# Patient Record
Sex: Female | Born: 1945 | Race: Black or African American | Hispanic: No | Smoking: Never smoker
Health system: Southern US, Community
[De-identification: ages and names within clinical notes are randomized; demographics above are authoritative.]

## PROBLEM LIST (undated history)

## (undated) DIAGNOSIS — I639 Cerebral infarction, unspecified: Secondary | ICD-10-CM

---

## 2018-11-11 ENCOUNTER — Emergency Department (HOSPITAL_COMMUNITY): Payer: Medicare HMO

## 2018-11-11 ENCOUNTER — Other Ambulatory Visit: Payer: Self-pay

## 2018-11-11 ENCOUNTER — Inpatient Hospital Stay (HOSPITAL_COMMUNITY)
Admission: EM | Admit: 2018-11-11 | Discharge: 2018-11-14 | DRG: 871 | Disposition: A | Discharge status: Home Health | Payer: Medicare HMO | Attending: Internal Medicine | Admitting: Internal Medicine

## 2018-11-11 DIAGNOSIS — R6520 Severe sepsis without septic shock: Secondary | ICD-10-CM | POA: Diagnosis present

## 2018-11-11 DIAGNOSIS — R0602 Shortness of breath: Secondary | ICD-10-CM | POA: Diagnosis present

## 2018-11-11 DIAGNOSIS — Z91041 Radiographic dye allergy status: Secondary | ICD-10-CM | POA: Diagnosis not present

## 2018-11-11 DIAGNOSIS — Z9181 History of falling: Secondary | ICD-10-CM | POA: Diagnosis not present

## 2018-11-11 DIAGNOSIS — N390 Urinary tract infection, site not specified: Secondary | ICD-10-CM | POA: Diagnosis present

## 2018-11-11 DIAGNOSIS — B9620 Unspecified Escherichia coli [E. coli] as the cause of diseases classified elsewhere: Secondary | ICD-10-CM | POA: Diagnosis not present

## 2018-11-11 DIAGNOSIS — R000 Tachycardia, unspecified: Secondary | ICD-10-CM | POA: Diagnosis present

## 2018-11-11 DIAGNOSIS — E876 Hypokalemia: Secondary | ICD-10-CM | POA: Diagnosis present

## 2018-11-11 DIAGNOSIS — N179 Acute kidney failure, unspecified: Secondary | ICD-10-CM | POA: Diagnosis present

## 2018-11-11 DIAGNOSIS — A419 Sepsis, unspecified organism: Secondary | ICD-10-CM | POA: Diagnosis present

## 2018-11-11 DIAGNOSIS — A4151 Sepsis due to Escherichia coli [E. coli]: Secondary | ICD-10-CM | POA: Diagnosis present

## 2018-11-11 DIAGNOSIS — R062 Wheezing: Secondary | ICD-10-CM | POA: Diagnosis present

## 2018-11-11 DIAGNOSIS — I10 Essential (primary) hypertension: Secondary | ICD-10-CM | POA: Diagnosis present

## 2018-11-11 DIAGNOSIS — N3001 Acute cystitis with hematuria: Secondary | ICD-10-CM

## 2018-11-11 DIAGNOSIS — E785 Hyperlipidemia, unspecified: Secondary | ICD-10-CM | POA: Diagnosis present

## 2018-11-11 DIAGNOSIS — Z1611 Resistance to penicillins: Secondary | ICD-10-CM | POA: Diagnosis present

## 2018-11-11 DIAGNOSIS — Z8673 Personal history of transient ischemic attack (TIA), and cerebral infarction without residual deficits: Secondary | ICD-10-CM

## 2018-11-11 DIAGNOSIS — Z20828 Contact with and (suspected) exposure to other viral communicable diseases: Secondary | ICD-10-CM | POA: Diagnosis present

## 2018-11-11 DIAGNOSIS — G9341 Metabolic encephalopathy: Secondary | ICD-10-CM | POA: Diagnosis present

## 2018-11-11 DIAGNOSIS — R7881 Bacteremia: Secondary | ICD-10-CM

## 2018-11-11 DIAGNOSIS — R5381 Other malaise: Secondary | ICD-10-CM

## 2018-11-11 LAB — BLOOD CULTURE ID PANEL (REFLEXED)

## 2018-11-11 LAB — URINALYSIS, ROUTINE W REFLEX MICROSCOPIC
Bilirubin Urine: NEGATIVE
Glucose, UA: NEGATIVE mg/dL
Ketones, ur: NEGATIVE mg/dL
Nitrite: POSITIVE — AB
Protein, ur: 100 mg/dL — AB
Specific Gravity, Urine: 1.018 (ref 1.005–1.030)
pH: 5 (ref 5.0–8.0)

## 2018-11-11 LAB — CBC WITH DIFFERENTIAL/PLATELET
Abs Immature Granulocytes: 0.05 10*3/uL (ref 0.00–0.07)
Basophils Absolute: 0 10*3/uL (ref 0.0–0.1)
Basophils Relative: 0 %
Eosinophils Absolute: 0 10*3/uL (ref 0.0–0.5)
Eosinophils Relative: 0 %
HCT: 39 % (ref 36.0–46.0)
Hemoglobin: 11.8 g/dL — ABNORMAL LOW (ref 12.0–15.0)
Immature Granulocytes: 1 %
Lymphocytes Relative: 8 %
Lymphs Abs: 0.8 10*3/uL (ref 0.7–4.0)
MCH: 25.9 pg — ABNORMAL LOW (ref 26.0–34.0)
MCHC: 30.3 g/dL (ref 30.0–36.0)
MCV: 85.7 fL (ref 80.0–100.0)
Monocytes Absolute: 0.5 10*3/uL (ref 0.1–1.0)
Monocytes Relative: 5 %
Neutro Abs: 9.3 10*3/uL — ABNORMAL HIGH (ref 1.7–7.7)
Neutrophils Relative %: 86 %
Platelets: 265 10*3/uL (ref 150–400)
RBC: 4.55 MIL/uL (ref 3.87–5.11)
RDW: 15.1 % (ref 11.5–15.5)
WBC: 10.8 10*3/uL — ABNORMAL HIGH (ref 4.0–10.5)
nRBC: 0 % (ref 0.0–0.2)

## 2018-11-11 LAB — APTT: aPTT: 31 seconds (ref 24–36)

## 2018-11-11 LAB — PROTIME-INR
INR: 1.2 (ref 0.8–1.2)
Prothrombin Time: 14.8 seconds (ref 11.4–15.2)

## 2018-11-11 LAB — COMPREHENSIVE METABOLIC PANEL
ALT: 25 U/L (ref 0–44)
AST: 51 U/L — ABNORMAL HIGH (ref 15–41)
Albumin: 2.8 g/dL — ABNORMAL LOW (ref 3.5–5.0)
Alkaline Phosphatase: 97 U/L (ref 38–126)
Anion gap: 12 (ref 5–15)
BUN: 27 mg/dL — ABNORMAL HIGH (ref 8–23)
CO2: 18 mmol/L — ABNORMAL LOW (ref 22–32)
Calcium: 8.4 mg/dL — ABNORMAL LOW (ref 8.9–10.3)
Chloride: 110 mmol/L (ref 98–111)
Creatinine, Ser: 1.59 mg/dL — ABNORMAL HIGH (ref 0.44–1.00)
GFR calc Af Amer: 36 mL/min — ABNORMAL LOW (ref >60)
GFR calc non Af Amer: 31 mL/min — ABNORMAL LOW (ref >60)
Glucose, Bld: 119 mg/dL — ABNORMAL HIGH (ref 70–99)
Potassium: 4.1 mmol/L (ref 3.5–5.1)
Sodium: 140 mmol/L (ref 135–145)
Total Bilirubin: 1.4 mg/dL — ABNORMAL HIGH (ref 0.3–1.2)
Total Protein: 7 g/dL (ref 6.5–8.1)

## 2018-11-11 LAB — CBG MONITORING, ED: Glucose-Capillary: 103 mg/dL — ABNORMAL HIGH (ref 70–99)

## 2018-11-11 LAB — LACTIC ACID, PLASMA
Lactic Acid, Venous: 1.6 mmol/L (ref 0.5–1.9)
Lactic Acid, Venous: 2.8 mmol/L (ref 0.5–1.9)
Lactic Acid, Venous: 3.4 mmol/L (ref 0.5–1.9)
Lactic Acid, Venous: 1.8 mmol/L (ref 0.5–1.9)

## 2018-11-11 LAB — SARS CORONAVIRUS 2 BY RT PCR (HOSPITAL ORDER, PERFORMED IN ~~LOC~~ HOSPITAL LAB): SARS Coronavirus 2: NEGATIVE

## 2018-11-11 LAB — TROPONIN I (HIGH SENSITIVITY): Troponin I (High Sensitivity): 9 ng/L (ref <18)

## 2018-11-11 LAB — CK: Total CK: 1535 U/L — ABNORMAL HIGH (ref 38–234)

## 2018-11-11 MED ORDER — SODIUM CHLORIDE 0.9 % IV SOLN
1.0000 g | INTRAVENOUS | Status: DC
  Administered 2018-11-11: 1 g via INTRAVENOUS
  Filled 2018-11-11: qty 10

## 2018-11-11 MED ORDER — SODIUM CHLORIDE 0.9 % IV BOLUS
1000.0000 mL | Freq: Once | INTRAVENOUS | Status: AC
  Administered 2018-11-11: 1000 mL via INTRAVENOUS

## 2018-11-11 MED ORDER — ONDANSETRON HCL 4 MG/2ML IJ SOLN: 4.0000 mg | Freq: Four times a day (QID) | INTRAMUSCULAR | Status: DC | PRN

## 2018-11-11 MED ORDER — ENOXAPARIN SODIUM 40 MG/0.4ML ~~LOC~~ SOLN
40.0000 mg | SUBCUTANEOUS | Status: DC
  Administered 2018-11-11 – 2018-11-13 (×3): 40 mg via SUBCUTANEOUS
  Filled 2018-11-11 (×4): qty 0.4

## 2018-11-11 MED ORDER — LORAZEPAM 2 MG/ML IJ SOLN: 0.5000 mg | Freq: Once | INTRAMUSCULAR | Status: DC

## 2018-11-11 MED ORDER — VANCOMYCIN HCL IN DEXTROSE 1-5 GM/200ML-% IV SOLN
1000.0000 mg | Freq: Once | INTRAVENOUS | Status: AC
  Administered 2018-11-11: 1000 mg via INTRAVENOUS
  Filled 2018-11-11: qty 200

## 2018-11-11 MED ORDER — SODIUM CHLORIDE 0.9 % IV SOLN
2.0000 g | INTRAVENOUS | Status: DC
  Administered 2018-11-12 – 2018-11-13 (×2): 2 g via INTRAVENOUS
  Filled 2018-11-11 (×2): qty 20

## 2018-11-11 MED ORDER — ALBUTEROL SULFATE (2.5 MG/3ML) 0.083% IN NEBU
2.5000 mg | INHALATION_SOLUTION | Freq: Once | RESPIRATORY_TRACT | Status: AC
  Administered 2018-11-12: 2.5 mg via RESPIRATORY_TRACT
  Filled 2018-11-11: qty 3

## 2018-11-11 MED ORDER — SODIUM CHLORIDE 0.9 % IV BOLUS
500.0000 mL | Freq: Once | INTRAVENOUS | Status: AC
  Administered 2018-11-11: 500 mL via INTRAVENOUS

## 2018-11-11 MED ORDER — METRONIDAZOLE IN NACL 5-0.79 MG/ML-% IV SOLN
500.0000 mg | Freq: Once | INTRAVENOUS | Status: AC
  Administered 2018-11-11: 500 mg via INTRAVENOUS
  Filled 2018-11-11: qty 100

## 2018-11-11 MED ORDER — SODIUM CHLORIDE 0.9 % IV SOLN
2.0000 g | Freq: Once | INTRAVENOUS | Status: AC
  Administered 2018-11-11: 2 g via INTRAVENOUS
  Filled 2018-11-11: qty 2

## 2018-11-11 MED ORDER — ONDANSETRON HCL 4 MG PO TABS: 4.0000 mg | ORAL_TABLET | Freq: Four times a day (QID) | ORAL | Status: DC | PRN

## 2018-11-11 MED ORDER — ACETAMINOPHEN 650 MG RE SUPP
650.0000 mg | Freq: Once | RECTAL | Status: AC
  Administered 2018-11-11: 650 mg via RECTAL
  Filled 2018-11-11: qty 1

## 2018-11-11 MED ORDER — ACETAMINOPHEN 325 MG PO TABS
650.0000 mg | ORAL_TABLET | Freq: Four times a day (QID) | ORAL | Status: DC | PRN
  Administered 2018-11-12 – 2018-11-14 (×4): 650 mg via ORAL
  Filled 2018-11-11 (×4): qty 2

## 2018-11-11 MED ORDER — ACETAMINOPHEN 650 MG RE SUPP: 650.0000 mg | Freq: Four times a day (QID) | RECTAL | Status: DC | PRN

## 2018-11-11 NOTE — Significant Event (Signed)
Rapid Response Event Note  Overview:  Sepsis - Second Set of Eyes  Initial Focused Assessment: I came to Mariah May at the request of the primary nurse. Patient was sleeping when I came in, she woke to my voice, answer my questions, followed simple commands but quickly feel asleep after. Not in acute distress, skin warm and dry, + pulses, HR 70s, RR 18-20, 100% on RA, and SBP > 100 and MAP > 70s. Lung sounds - clear, good air movement.  Patient is being treated for UA - on Rocephin ( received Maxipime and Flagyl as well). Has received a total of 1.5L of NS (no current weight so unable to determine 30cc/kg for fluid resuscitation). LA 3.4 > 1.8   Interventions: -- None--  Plan of Care: -- Monitor VS  -- Strict I/O -- Update weight when able.  Event Summary:  Call Time Mariah May  Mariah May

## 2018-11-11 NOTE — Progress Notes (Signed)
PHARMACY - PHYSICIAN COMMUNICATION CRITICAL VALUE ALERT - BLOOD CULTURE IDENTIFICATION (BCID)  Mariah May is an 73 y.o. female who presented to [facility] on 11/11/2018 with sepsis secondary to UTI.   Assessment:  Blood culture positive for gram negative rods BCID detected E.coli and Enterobacteriaceae   Current antibiotics: Ceftriaxone 1 g IV q24h  Changes to prescribed antibiotics recommended:  Will change to Ceftriaxone 2 g IV q24h due to bacteremia   Results for orders placed or performed during the hospital encounter of 11/11/18  Blood Culture ID Panel (Reflexed) (Collected: 11/11/2018  7:37 AM)  Result Value Ref Range   Enterococcus species NOT DETECTED NOT DETECTED   Listeria monocytogenes NOT DETECTED NOT DETECTED   Staphylococcus species NOT DETECTED NOT DETECTED   Staphylococcus aureus (BCID) NOT DETECTED NOT DETECTED   Streptococcus species NOT DETECTED NOT DETECTED   Streptococcus agalactiae NOT DETECTED NOT DETECTED   Streptococcus pneumoniae NOT DETECTED NOT DETECTED   Streptococcus pyogenes NOT DETECTED NOT DETECTED   Acinetobacter baumannii NOT DETECTED NOT DETECTED   Enterobacteriaceae species DETECTED (A) NOT DETECTED   Enterobacter cloacae complex NOT DETECTED NOT DETECTED   Escherichia coli DETECTED (A) NOT DETECTED   Klebsiella oxytoca NOT DETECTED NOT DETECTED   Klebsiella pneumoniae NOT DETECTED NOT DETECTED   Proteus species NOT DETECTED NOT DETECTED   Serratia marcescens NOT DETECTED NOT DETECTED   Carbapenem resistance NOT DETECTED NOT DETECTED   Haemophilus influenzae NOT DETECTED NOT DETECTED   Neisseria meningitidis NOT DETECTED NOT DETECTED   Pseudomonas aeruginosa NOT DETECTED NOT DETECTED   Candida albicans NOT DETECTED NOT DETECTED   Candida glabrata NOT DETECTED NOT DETECTED   Candida krusei NOT DETECTED NOT DETECTED   Candida parapsilosis NOT DETECTED NOT DETECTED   Candida tropicalis NOT DETECTED NOT DETECTED    Lorel Monaco, PharmD PGY1  Ambulatory Care Resident [employer] # [phone]

## 2018-11-11 NOTE — ED Triage Notes (Signed)
Coming by EMS after calling this evening for help getting up off of the couch. Pt called yesterday for same instance but did not want to come into the hospital. EMS stated pt had left facial droop and slurred speech. Last known normal unknown. Pt unaware to any deficits. Vitals WDL. Received a 500cc bolus. Hx of stroke, and pt is HOH.No noted facial droop during assessment

## 2018-11-11 NOTE — Progress Notes (Signed)
Notified provider of need to order lactic acid. Second lactic acid resulted at 2.8 (higher than first lactate) MD notified via secure chat asking for a repeat lactate to be ordered.

## 2018-11-11 NOTE — Progress Notes (Signed)
Patients daughter Vita Barley was communicated to update her about her mothers plan of care and treatment. Nurse informed her that the doctor will give them a called back to update as well.

## 2018-11-11 NOTE — Progress Notes (Signed)
  Pt orientation to unit, room and routine. Information packet given to patient/family and safety video watched.  Admission INP armband ID verified with patient/family, and in place. SR up x 2, fall risk assessment complete with Patient and family verbalizing understanding of risks associated with falls. Pt verbalizes an understanding of how to use the call bell and to call for help before getting out of bed.  Skin, clean-dry- intact without evidence of bruising, or skin tears.   No evidence of skin break down noted on exam.  Will cont to monitor and assist as needed.  [place], RN 11/11/2018 4:45 PM

## 2018-11-11 NOTE — ED Provider Notes (Signed)
[facility] EMERGENCY DEPARTMENT Provider Note   CSN: [id number] Arrival date & time: 11/11/18  0531    History   Chief Complaint Chief Complaint  Patient presents with  . Stroke Like Symptoms    HPI Mariah May is a 73 y.o. female with an unknown medical hx presents to the Emergency Department complaining of acute right shoulder pain onset Thursday night (11/09/18) after a fall.  Pt does not know why she fell.  She reports afterwards she got on the couch and has not gotten off the couch since that time.  Patient reports she has felt generally weak since then but has not noticed any focal deficits.  She reports chronic back pain but no new or worsening back pain.  Patient denies aggravating or alleviating factors.  She denies fever, chills, headache, neck pain, chest pain, shortness of breath, abdominal pain, nausea, vomiting, diarrhea, dysuria, hematuria.  Per EMS, they were dispatched this morning for lifting assistance as patient wanted help getting off the couch.  Upon their arrival they realized that she had urinated on herself and was too weak to be left alone.  Additionally, they report that patient was evaluated by EMS around 9 AM on 11/10/2018 the patient refused to go to the hospital at that time.  Per EMS, it appears the patient lives at home.  They did not see any assistance devices such as walker or cane in the home.  No family was present and patient was unable to provide contact information for family.  Medical records reviewed, it does not appear that patient has been seen in this emergency department before.  There are no records in epic.     The history is provided by the patient and medical records. No language interpreter was used.    No past medical history on file.  There are no active problems to display for this patient.   OB History   No obstetric history on file.      Home Medications    Prior to Admission medications   Not on File     Family History No family history on file.  Social History Social History   Tobacco Use  . Smoking status: Not on file  Substance Use Topics  . Alcohol use: Not on file  . Drug use: Not on file     Allergies   Contrast media [iodinated diagnostic agents]   Review of Systems Review of Systems  Constitutional: Negative for appetite change, diaphoresis, fatigue, fever and unexpected weight change.  HENT: Negative for mouth sores.   Eyes: Negative for visual disturbance.  Respiratory: Negative for cough, chest tightness, shortness of breath and wheezing.   Cardiovascular: Negative for chest pain.  Gastrointestinal: Negative for abdominal pain, constipation, diarrhea, nausea and vomiting.  Endocrine: Negative for polydipsia, polyphagia and polyuria.  Genitourinary: Negative for dysuria, frequency, hematuria and urgency.  Musculoskeletal: Positive for arthralgias. Negative for back pain and neck stiffness.  Skin: Negative for rash.  Allergic/Immunologic: Negative for immunocompromised state.  Neurological: Positive for weakness. Negative for syncope, light-headedness and headaches.  Hematological: Does not bruise/bleed easily.  Psychiatric/Behavioral: Negative for sleep disturbance. The patient is not nervous/anxious.      Physical Exam Updated Vital Signs BP (!) 125/99   Pulse (!) 103   Resp (!) 23   SpO2 98%   Physical Exam Vitals signs and nursing note reviewed.  Constitutional:      General: She is not in acute distress.    Appearance:  She is not diaphoretic.     Comments: Pt very hard of hearing  HENT:     Head: Normocephalic.  Eyes:     General: No scleral icterus.    Conjunctiva/sclera: Conjunctivae normal.  Neck:     Musculoskeletal: Normal range of motion.  Cardiovascular:     Rate and Rhythm: Normal rate and regular rhythm.     Pulses: Normal pulses.          Radial pulses are 2+ on the right side and 2+ on the left side.  Pulmonary:     Effort:  No tachypnea, accessory muscle usage, prolonged expiration, respiratory distress or retractions.     Breath sounds: No stridor.     Comments: Equal chest rise. No increased work of breathing. Abdominal:     General: There is no distension.     Palpations: Abdomen is soft.     Tenderness: There is no abdominal tenderness. There is no guarding or rebound.  Musculoskeletal:     Right shoulder: She exhibits pain. She exhibits normal range of motion and no swelling.     Right elbow: Normal.    Right wrist: Normal.       Arms:     Right lower leg: 1+ Edema present.     Left lower leg: 1+ Edema present.     Comments: Moves all extremities equally and without difficulty.  Skin:    General: Skin is warm and dry.     Capillary Refill: Capillary refill takes less than 2 seconds.  Neurological:     Mental Status: She is alert.     GCS: GCS eye subscore is 4. GCS verbal subscore is 5. GCS motor subscore is 6.     Comments: Mental Status:  Alert. Speech fluent without evidence of aphasia. Able to follow 1 step commands without difficulty.  Cranial Nerves:  II:  Peripheral visual fields grossly normal, pupils equal, round, reactive to light III,IV, VI: ptosis not present, extra-ocular motions intact bilaterally  V,VII: smile symmetric, facial light touch sensation equal VIII: hearing grossly normal to voice  X: uvula elevates symmetrically  XI: bilateral shoulder shrug symmetric and strong XII: midline tongue extension without fassiculations Motor:  Normal tone. 5/5 in upper and lower extremities bilaterally including strong and equal grip strength and dorsiflexion/plantar flexion Sensory: Light touch normal in all extremities.  Cerebellar: normal finger-to-nose with bilateral upper extremities Gait: gait not tested on initial exam CV: distal pulses palpable throughout   Psychiatric:        Mood and Affect: Mood normal.      ED Treatments / Results  Labs (all labs ordered are listed,  but only abnormal results are displayed) Labs Reviewed  CBC WITH DIFFERENTIAL/PLATELET - Abnormal; Notable for the following components:      Result Value   WBC 10.8 (*)    Hemoglobin 11.8 (*)    MCH 25.9 (*)    Neutro Abs 9.3 (*)    All other components within normal limits  COMPREHENSIVE METABOLIC PANEL - Abnormal; Notable for the following components:   CO2 18 (*)    Glucose, Bld 119 (*)    BUN 27 (*)    Creatinine, Ser 1.59 (*)    Calcium 8.4 (*)    Albumin 2.8 (*)    AST 51 (*)    Total Bilirubin 1.4 (*)    GFR calc non Af Amer 31 (*)    GFR calc Af Amer 36 (*)    All other  components within normal limits  CBG MONITORING, ED - Abnormal; Notable for the following components:   Glucose-Capillary 103 (*)    All other components within normal limits  CULTURE, BLOOD (ROUTINE X 2)  CULTURE, BLOOD (ROUTINE X 2)  URINE CULTURE  SARS CORONAVIRUS 2 (HOSPITAL ORDER, PERFORMED IN [facility])  LACTIC ACID, PLASMA  URINALYSIS, ROUTINE W REFLEX MICROSCOPIC  LACTIC ACID, PLASMA  CK  APTT  PROTIME-INR  TROPONIN I (HIGH SENSITIVITY)    EKG EKG Interpretation  Date/Time:  Saturday November 11 2018 05:48:18 EDT Ventricular Rate:  97 PR Interval:    QRS Duration: 163 QT Interval:  384 QTC Calculation: 488 R Axis:   -52 Text Interpretation:  Sinus rhythm Nonspecific IVCD with LAD Left ventricular hypertrophy No previous ECGs available Confirmed by Glynn Octaveancour, Stephen [phone]([id number]) on 11/11/2018 6:10:56 AM     Radiology Dg Chest 2 View  Result Date: 11/11/2018 CLINICAL DATA:  Patient with altered mental status EXAM: CHEST - 2 VIEW COMPARISON:  None. FINDINGS: Monitoring leads overlie the patient. Cardiomegaly. No large area of pulmonary consolidation. No pleural effusion or pneumothorax. Thoracic spine degenerative changes. Lateral view limited due to overlapping soft tissue. IMPRESSION: No acute cardiopulmonary process. Electronically Signed   By: Annia Beltrew  Davis M.D.   On:  11/11/2018 06:21   Dg Shoulder Right  Result Date: 11/11/2018 CLINICAL DATA:  Altered mental status slurred speech. EXAM: RIGHT SHOULDER - 2+ VIEW COMPARISON:  None. FINDINGS: Limited exam secondary to difficulty with patient positioning. Within the above limitation no evidence for acute displaced fracture or dislocation. Visualized right hemithorax unremarkable. IMPRESSION: Limited exam secondary to difficulty with patient positioning. No evidence for displaced fracture or dislocation. Electronically Signed   By: Annia Beltrew  Davis M.D.   On: 11/11/2018 06:20    Procedures .Critical Care Performed by: Dierdre ForthMuthersbaugh, Loden Laurent, PA-C Authorized by: Dierdre ForthMuthersbaugh, Alliana Mcauliff, PA-C   Critical care provider statement:    Critical care time (minutes):  45   Critical care time was exclusive of:  Separately billable procedures and treating other patients and teaching time   Critical care was necessary to treat or prevent imminent or life-threatening deterioration of the following conditions:  Sepsis   Critical care was time spent personally by me on the following activities:  Discussions with consultants, evaluation of patient's response to treatment, examination of patient, ordering and performing treatments and interventions, ordering and review of laboratory studies, ordering and review of radiographic studies, pulse oximetry, re-evaluation of patient's condition, obtaining history from patient or surrogate and review of old charts   I assumed direction of critical care for this patient from another provider in my specialty: no     (including critical care time)  Medications Ordered in ED Medications  ceFEPIme (MAXIPIME) 2 g in sodium chloride 0.9 % 100 mL IVPB (has no administration in time range)  metroNIDAZOLE (FLAGYL) IVPB 500 mg (has no administration in time range)  vancomycin (VANCOCIN) IVPB 1000 mg/200 mL premix (has no administration in time range)  sodium chloride 0.9 % bolus 1,000 mL (has no  administration in time range)     Initial Impression / Assessment and Plan / ED Course  I have reviewed the triage vital signs and the nursing notes.  Pertinent labs & imaging results that were available during my care of the patient were reviewed by me and considered in my medical decision making (see chart for details).  Clinical Course as of Nov 10 712  Sat Nov 11, 2018  [id number] Pt  febrile. Code sepsis initiated.   Temp(!): 101.7 F (38.7 C) [HM]    Clinical Course User Index [HM] Lyndall Windt, Dahlia ClientHannah, PA-C       Richard MiuHelen Omary was evaluated in Emergency Department on 11/11/2018 for the symptoms described in the history of present illness. She was evaluated in the context of the global COVID-19 pandemic, which necessitated consideration that the patient might be at risk for infection with the SARS-CoV-2 virus that causes COVID-19. Institutional protocols and algorithms that pertain to the evaluation of patients at risk for COVID-19 are in a state of rapid change based on information released by regulatory bodies including the CDC and federal and state organizations. These policies and algorithms were followed during the patient's care in the ED.   Presents with weakness after fall.  She has not walked in several days.  Patient incontinent of urine.  On exam she is tachycardic and slightly tachypneic.  Febrile.  Code sepsis called.  Fluids and abx.  Mild anemia, elevated serum creatinine, normal lactic acid.  Patient has never been seen here in the emergency department or in the system before.  Unknown baseline.  Question new AKI versus chronic kidney disease.  Chest x-ray without evidence of pneumonia or infiltrates.  No pulmonary edema.  Right shoulder without evidence of fracture or dislocation.  Pt will need to be admitted.  Additional Labs and imaging pending.  COVID pending.   7:12 AM At shift change care was transferred to [facility] Fawze, PA-C who will follow pending studies,  re-evaulate and determine disposition.    The patient was discussed with and seen by Dr. Manus Gunningancour who agrees with the treatment plan.    Final Clinical Impressions(s) / ED Diagnoses   Final diagnoses:  Sepsis with acute renal failure without septic shock, due to unspecified organism, unspecified acute renal failure type (HCC)  AKI (acute kidney injury) [facility](HCC)  Tachycardia    ED Discharge Orders    None       Milta DeitersMuthersbaugh, Mava Suares, PA-C 11/11/18 0715    Glynn Octaveancour, Stephen, MD 11/12/18 [phone]

## 2018-11-11 NOTE — Progress Notes (Signed)
Patient's daughter, Stephannie Peters, called.  Daughter updated on patient's condition.

## 2018-11-11 NOTE — ED Notes (Addendum)
PT's daughter Vita [phone]) and son ([phone]) gave history of patient  Pt just moved from [place], has had multiple Strokes, frequent falls , HLD, HTN, uses a rollator to ambulate, does not use oxygen at home, lives about 5-72mins from son. They are unsure of her med dosage but believes she takes amlodipine and atorvasttin.  Patient's daughter states that sometimes she could get anxious and requires a "talk-down."

## 2018-11-11 NOTE — H&P (Signed)
Date: 11/11/2018               Patient Name:  Mariah May MRN: [id number]  DOB: Nov 10, 1945 Age / Sex: 73 y.o., female   PCP: Patient, No Pcp Per         Medical Service: Internal Medicine Teaching Service         Attending Physician: Dr. Aldine Contes, MD    First Contact: Dr. Charleen Kirks Pager: [phone]  Second Contact: Dr. Tarri Abernethy Pager: [phone]       After Hours (After 5p/  First Contact Pager: [phone]  weekends / holidays): Second Contact Pager: [phone]   Chief Complaint: Altered mental status  History of Present Illness:   Mariah May is a 73 y/o female with unknown past medical history who presents to the ED with altered mental status.   Mariah May was unable to provide a history of the recent events. Mariah May stated Mariah May was unable to remember what occurred in the past couple days, not the ambulance ride. Endorses SOB but denies chest pain, headache, dizziness, abdominal pain, dysuria, frequency.   Per EMS, they were called out to her home yesterday at 9am for patient needing assistance onto her couch. At that time Mariah May denied wanting to go to the hospital. Today, they were called out again for fall. Upon arrival, EMS found patient on the couch and soaked with urine. Mariah May told them Mariah May had not eaten or drank anything since the day before when Mariah May got on the couch. EMS noted left sided drooping and slurred speech, but no weakness in extremities. Mariah May was tachycardic, tachypneic and febrile.   Attempted to call son and daughter for collateral information but no answer.   ED Course:  CT Head showed advanced chronic vessel ischemia in the bilateral thalami white matter.  No acute infarct.  Chest x-ray was negative.  Shoulder x-ray negative.  EBC showed an elevated white blood count of 10.8 and hemoglobin of 11.8.  CMP showed normal sodium and potassium, however bicarb of 18 and normal anion gap.  Evidence of acute kidney injury with a creatinine of 1.59 and BUN of 27.  AST is  mildly elevated at 51, with a normal ALT.  Lactic acid was trended initially 1.6 but increased to 2.8.  Increased again to 3.4 but went down to 1.8.  PT INR unremarkable.  UA showed large hemoglobin, 100 protein, positive nitrite, moderate leukocyte, many bacteria.  Blood cultures drawn and pending.   Meds:  Current Meds  Medication Sig  . amLODipine (NORVASC) 5 MG tablet Take 5 mg by mouth daily.  Marland Kitchen atorvastatin (LIPITOR) 40 MG tablet Take 40 mg by mouth daily.  . diclofenac sodium (VOLTAREN) 1 % GEL Apply 2 g topically 4 (four) times daily as needed for pain.    Allergies: Allergies as of 11/11/2018 - Review Complete 11/11/2018  Allergen Reaction Noted  . Contrast media [iodinated diagnostic agents]  11/11/2018   No past medical history on file.  Family History:  Unable to obtain family history.  Social History:  Per nursing note, patient recently moved here from [place].  Lives alone.  Review of Systems: A complete ROS was negative except as per HPI.   Physical Exam: Blood pressure (!) 104/52, pulse 78, temperature 98 F (36.7 C), temperature source Oral, resp. rate 18, SpO2 99 %.  Physical Exam Vitals signs and nursing note reviewed.  Constitutional:      Interventions: Face mask in place.     Comments:  Patient allows me to listen for heart sounds but denied consent for rest of exam.  Cardiovascular:     Rate and Rhythm: Normal rate and regular rhythm.     Heart sounds: No murmur. No gallop.   Pulmonary:     Effort: No respiratory distress.  Skin:    General: Skin is warm and dry.     Coloration: Skin is not jaundiced or pale.  Neurological:     Mental Status: Mariah May is lethargic and disoriented.  Psychiatric:        Behavior: Behavior is uncooperative.    EKG: personally reviewed my interpretation is: Lots of artifact. Tachycardia.   CXR: personally reviewed my interpretation is: No evidence of pleural effusions.  No right sided infiltrates.  Difficulty in  viewing left cardiac silhouette.  Assessment & Plan by Problem: Active Problems:   Sepsis secondary to UTI [facility](HCC)   #Urosepsis: Patient presented to the ED with tachycardia, tachypnea and febrile.  UA results were negative of UTI as demonstrated by positive nitrite, moderate leukocytes, many bacteria and 21-50 WBCs.  Blood cultures are pending. Mariah May received Maxipime, Vancomycin, and Metronidazole in the ED. Vital signs have stabilized, with the exception of soft BP at 100s systolic. MAP is 67-71. Pt not on any HTN meds currently. Will continue to closely monitor.   -Ceftriaxone  -Trend WBC -Telemetry - F/U blood cultures   #Lactic acidosis: Lactic acid was was initially 1.6 but trended upwards to a maximum of 3.4.  It has resolved with fluid resuscitation, NS totaling 2.5L.   Resolved.   #AKI: Patient's past medical history is uncertain, so Mariah May could have a history of chronic kidney disease that would bump her creatinine.  Today her BUN is 27 with a creatinine of 1.59. Mariah May has received 2.5L of NS. Will reassess with CMP tomorrow AM, as well as attempt to gather additional information from family.    Dispo: Admit patient to Inpatient with expected length of stay greater than 2 midnights.   Signed:  Dr. Verdene LennertIulia Mazal [facility] Internal Medicine PGY-1  Pager: [phone] 11/11/2018, 7:22 PM

## 2018-11-11 NOTE — ED Provider Notes (Signed)
Received patient at signout from [facility].  Refer to provider note for full history and physical examination.  Briefly, patient is a 73 year old female with unknown past medical history presenting for evaluation after fall of unknown etiology on Thursday 2 days ago.  It is likely she was left on her couch for 36 to 48 hours.  Patient found to be altered and septic, febrile with a leukocytosis.  Broad-spectrum antibiotics were initiated.  Chest x-ray is negative.  Imaging of the head and neck show no acute abnormalities.  No evidence of CVA.  Pending COVID swab and UA.  Will likely require admission.  Physical Exam  BP (!) 148/77   Pulse (!) 101   Temp (!) 101.7 F (38.7 C) (Oral)   Resp (!) 27   LMP  (LMP Unknown)   SpO2 98%   Physical Exam Vitals signs and nursing note reviewed.  Constitutional:      General: She is not in acute distress.    Appearance: She is well-developed.  HENT:     Head: Normocephalic and atraumatic.     Mouth/Throat:     Mouth: Mucous membranes are dry.  Eyes:     General:        Right eye: No discharge.        Left eye: No discharge.     Conjunctiva/sclera: Conjunctivae normal.  Neck:     Vascular: No JVD.     Trachea: No tracheal deviation.  Cardiovascular:     Rate and Rhythm: Regular rhythm. Tachycardia present.     Comments: 2+ radial and DP pulses bilaterally.  Trace pitting edema of the bilateral lower extremities near the ankle. Pulmonary:     Effort: Pulmonary effort is normal.     Breath sounds: Normal breath sounds.  Abdominal:     General: There is no distension.     Palpations: Abdomen is soft.     Tenderness: There is no abdominal tenderness. There is no guarding or rebound.  Skin:    General: Skin is warm and dry.     Findings: No erythema.  Neurological:     Mental Status: She is alert.     GCS: GCS eye subscore is 4. GCS verbal subscore is 5. GCS motor subscore is 6.     Comments: No facial droop.  Follows some commands.  She  is hard of hearing.  Moves extremity spontaneously without difficulty.  Sensation intact to light touch of upper and lower extremities.  Oriented to person and place only.  Psychiatric:        Behavior: Behavior normal.     ED Course/Procedures   Labs Reviewed  CBC WITH DIFFERENTIAL/PLATELET - Abnormal; Notable for the following components:      Result Value   WBC 10.8 (*)    Hemoglobin 11.8 (*)    MCH 25.9 (*)    Neutro Abs 9.3 (*)    All other components within normal limits  COMPREHENSIVE METABOLIC PANEL - Abnormal; Notable for the following components:   CO2 18 (*)    Glucose, Bld 119 (*)    BUN 27 (*)    Creatinine, Ser 1.59 (*)    Calcium 8.4 (*)    Albumin 2.8 (*)    AST 51 (*)    Total Bilirubin 1.4 (*)    GFR calc non Af Amer 31 (*)    GFR calc Af Amer 36 (*)    All other components within normal limits  URINALYSIS, ROUTINE W REFLEX  MICROSCOPIC - Abnormal; Notable for the following components:   APPearance HAZY (*)    Hgb urine dipstick LARGE (*)    Protein, ur 100 (*)    Nitrite POSITIVE (*)    Leukocytes,Ua MODERATE (*)    Bacteria, UA MANY (*)    All other components within normal limits  LACTIC ACID, PLASMA - Abnormal; Notable for the following components:   Lactic Acid, Venous 2.8 (*)    All other components within normal limits  CK - Abnormal; Notable for the following components:   Total CK 1,535 (*)    All other components within normal limits  CBG MONITORING, ED - Abnormal; Notable for the following components:   Glucose-Capillary 103 (*)    All other components within normal limits  CULTURE, BLOOD (ROUTINE X 2)  CULTURE, BLOOD (ROUTINE X 2)  URINE CULTURE  SARS CORONAVIRUS 2 (HOSPITAL ORDER, PERFORMED IN [facility])  LACTIC ACID, PLASMA  APTT  PROTIME-INR  TROPONIN I (HIGH SENSITIVITY)    MDM   UA suggestive of UTI.  Possible AKI though unclear of patient's baseline.  CK is elevated at 1535 suggesting rhabdomyolysis.  Initial  lactate negative, repeat trending upward at 2.8.  She did receive 1 L normal saline, though due to unknown medical history additional fluids were held in order to avoid volume overload/pulmonary edema in an otherwise hemodynamically stable patient.  We will add an additional 500 cc bolus.  Patient tells me that she is originally from [place] but moved here several years ago and has not been seen by her primary care physician in quite some time.  9:08 AM RN was able to contact patient's daughter Zenon MayoSheena and son [place].  Patient has a history of hypertension, hyperlipidemia, osteoarthritis, multiple TIAs and CVA previously.  She takes amlodipine and atorvastatin. Is seen by Dr. Mortimer Friessai-Bonsu.  Son reports that he last saw the patient yesterday and noted that she was a bit confused.  They do not believe that she had been immobilized for up to 48 hours.  They live near the patient and check up on her frequently but she does live independently.  She does ambulate with the aid of a Rollator and walker.  Patient had an episode in the ED in which she was tachypneic and anxious with improvement with coaching.  Repeat chest x-ray shows no evidence of volume overload or acute pulmonary edema.  Internal medicine teaching service to admit.  COVID test is negative.      Jeanie SewerFawze, Jariana Shumard A, PA-C 11/11/18 1021    Cathren LaineSteinl, Kevin, MD 11/12/18 [phone]

## 2018-11-11 NOTE — ED Notes (Signed)
Patient's children states patient does not use home oxygen O2 Latta removed Will continue to re-assess

## 2018-11-12 DIAGNOSIS — A4151 Sepsis due to Escherichia coli [E. coli]: Principal | ICD-10-CM

## 2018-11-12 DIAGNOSIS — N390 Urinary tract infection, site not specified: Secondary | ICD-10-CM

## 2018-11-12 DIAGNOSIS — B9620 Unspecified Escherichia coli [E. coli] as the cause of diseases classified elsewhere: Secondary | ICD-10-CM

## 2018-11-12 DIAGNOSIS — R7881 Bacteremia: Secondary | ICD-10-CM

## 2018-11-12 DIAGNOSIS — R6520 Severe sepsis without septic shock: Secondary | ICD-10-CM

## 2018-11-12 DIAGNOSIS — N179 Acute kidney failure, unspecified: Secondary | ICD-10-CM

## 2018-11-12 DIAGNOSIS — E876 Hypokalemia: Secondary | ICD-10-CM

## 2018-11-12 DIAGNOSIS — R062 Wheezing: Secondary | ICD-10-CM

## 2018-11-12 LAB — COMPREHENSIVE METABOLIC PANEL
ALT: 33 U/L (ref 0–44)
AST: 59 U/L — ABNORMAL HIGH (ref 15–41)
Albumin: 2.3 g/dL — ABNORMAL LOW (ref 3.5–5.0)
Alkaline Phosphatase: 80 U/L (ref 38–126)
Anion gap: 9 (ref 5–15)
BUN: 17 mg/dL (ref 8–23)
CO2: 18 mmol/L — ABNORMAL LOW (ref 22–32)
Calcium: 8.1 mg/dL — ABNORMAL LOW (ref 8.9–10.3)
Chloride: 113 mmol/L — ABNORMAL HIGH (ref 98–111)
Creatinine, Ser: 1.02 mg/dL — ABNORMAL HIGH (ref 0.44–1.00)
GFR calc Af Amer: >60 mL/min (ref >60)
GFR calc non Af Amer: 54 mL/min — ABNORMAL LOW (ref >60)
Glucose, Bld: 114 mg/dL — ABNORMAL HIGH (ref 70–99)
Potassium: 3.2 mmol/L — ABNORMAL LOW (ref 3.5–5.1)
Sodium: 140 mmol/L (ref 135–145)
Total Bilirubin: 0.8 mg/dL (ref 0.3–1.2)
Total Protein: 6.1 g/dL — ABNORMAL LOW (ref 6.5–8.1)

## 2018-11-12 LAB — CBC
HCT: 33.4 % — ABNORMAL LOW (ref 36.0–46.0)
Hemoglobin: 10.5 g/dL — ABNORMAL LOW (ref 12.0–15.0)
MCH: 26.1 pg (ref 26.0–34.0)
MCHC: 31.4 g/dL (ref 30.0–36.0)
MCV: 82.9 fL (ref 80.0–100.0)
Platelets: 191 10*3/uL (ref 150–400)
RBC: 4.03 MIL/uL (ref 3.87–5.11)
RDW: 15 % (ref 11.5–15.5)
WBC: 7.3 10*3/uL (ref 4.0–10.5)
nRBC: 0 % (ref 0.0–0.2)

## 2018-11-12 MED ORDER — POTASSIUM CHLORIDE 10 MEQ/100ML IV SOLN: 10.0000 meq | INTRAVENOUS | Status: DC

## 2018-11-12 MED ORDER — IPRATROPIUM-ALBUTEROL 0.5-2.5 (3) MG/3ML IN SOLN
3.0000 mL | Freq: Four times a day (QID) | RESPIRATORY_TRACT | Status: DC
  Administered 2018-11-12: 3 mL via RESPIRATORY_TRACT

## 2018-11-12 MED ORDER — IPRATROPIUM-ALBUTEROL 0.5-2.5 (3) MG/3ML IN SOLN: 3.0000 mL | Freq: Four times a day (QID) | RESPIRATORY_TRACT | Status: DC | PRN

## 2018-11-12 MED ORDER — POTASSIUM CHLORIDE 20 MEQ PO PACK
40.0000 meq | PACK | Freq: Two times a day (BID) | ORAL | Status: AC
  Administered 2018-11-12 (×2): 40 meq via ORAL
  Filled 2018-11-12 (×2): qty 2

## 2018-11-12 MED ORDER — IPRATROPIUM-ALBUTEROL 0.5-2.5 (3) MG/3ML IN SOLN
3.0000 mL | Freq: Four times a day (QID) | RESPIRATORY_TRACT | Status: DC
  Administered 2018-11-12: 3 mL via RESPIRATORY_TRACT
  Filled 2018-11-12: qty 3

## 2018-11-12 MED ORDER — IPRATROPIUM-ALBUTEROL 0.5-2.5 (3) MG/3ML IN SOLN: 3.0000 mL | Freq: Four times a day (QID) | RESPIRATORY_TRACT | Status: DC

## 2018-11-12 NOTE — Progress Notes (Signed)
   Subjective:    Mariah May is feeling much better today and is very hungry. No acute complaints at this time, other than wanting to eat.   Objective: Vital signs in last 24 hours: Vitals:   11/11/18 1722 11/12/18 0131 11/12/18 0141 11/12/18 0622  BP: (!) 104/52  (!) 110/55   Pulse: 78  60   Resp: 18  (!) 24   Temp:   98.2 F (36.8 C) 98.7 F (37.1 C)  TempSrc:   Oral Oral  SpO2: 99% 99% 100%    Physical Exam Vitals signs and nursing note reviewed.  Constitutional:      Appearance: She is obese.  HENT:     Head: Normocephalic and atraumatic.     Mouth/Throat:     Mouth: Mucous membranes are dry.  Cardiovascular:     Rate and Rhythm: Normal rate and regular rhythm.  Pulmonary:     Effort: Pulmonary effort is normal. No respiratory distress.     Breath sounds: Wheezing present. No rales.  Abdominal:     General: Bowel sounds are normal. There is no distension.     Palpations: Abdomen is soft.     Tenderness: There is no abdominal tenderness.  Skin:    General: Skin is warm and dry.  Neurological:     General: No focal deficit present.     Mental Status: She is alert.     Comments: Oriented to person and place. Significantly improved mental status.     Assessment/Plan:  Active Problems:   Sepsis secondary to UTI ([place])  #E. Coli Urosepsis:  Last night, patient's blood and urine cultures returned positive for E. Coli. Pharmacy increased her Ceftriaxone dose to 2g. Today, Mariah May has significantly improved. She has much better mentation, as well as stabilization of her vital signs.  No febrile events in the past 24 hours.  Leukocytosis has resolved.  We will continue to monitor.   Discussed with patient that she has bacteria in her blood and the current treatment plan. All questions and concerns addressed.   - Ceftriaxone 2 g - Telemetry   #AKI: Creatinine has improved substantially since admission.  Creatinine is now 1.02.  We will continue to trend.    #Wheezing Patient denies a history of COPD or wheezing in the past, however her exam showed diffuse wheezing bilaterally.  She is still somewhat altered, unknown if dementia is also playing a role, and has been unable to reach her family to get past medical history.  Albuterol treatment last night helped, so plan to schedule duo nebs.  -DuoNeb every 6 hours    #Hypokalemia Potassium is mildly decreased today at 3.2.  Replenished with 40 mEq of K-Dur.  Will reassess tomorrow     Dispo: Anticipated discharge pending clinical improvement.   Dr. Jose Persia Internal Medicine PGY-1  Pager: [phone] 11/12/2018, 1:07 PM

## 2018-11-12 NOTE — Progress Notes (Signed)
Tried to contact the patient's daughter, Stephannie Peters, to discuss patient's progression and current plan. No answer. No voicemail left.   Ina Homes, MD  [facility] PGY3  Pager: [phone]

## 2018-11-12 NOTE — Plan of Care (Signed)

## 2018-11-12 NOTE — Progress Notes (Signed)
   11/12/18 0000  Pre-Screen Questions- If "YES" to any of the following questions, STOP the screen, keep NPO, and place order for SLP eval and treat   Home diet required thickened liquids No  Trach tube present No  Radiation to Head/Neck No  Patient Readiness for Screen - If "YES" to any of the following questions, WAIT to screen, keep NPO  Is patient lethargic or unable to stay alert/awake? No  HOB restricted to <30 degrees No  NPO for planned procedure No  Brief Cognitive Screen- Aspiration Risk Assessment  Is patient oriented to name, place, or year? No  Able to open mouth, stick out tongue, or smile No  Oral Mechanism Exam  Able to seal lips No  Able to move tongue from side to side Yes  Face is symmetric No  Mandatory Oral Care Performed   Mandatory oral care performed Yes  3 oz Water Swallow Challenge  Does the patient stop drinking? (!) Yes  Does the patient cough/choke? No  Screening Result (!) Failed   Patient has failed swallow screening due to cognition.  Patient was asleep during most of day, per day shift RN.  Patient was asleep for most of evening.   Patient HOB raised to 90 degrees and given water via cup and straw.  Patient abel to hold cup, but difficulty forming seal on straw d/t hx previous stroke.    Patient was instructed to drink liquid without stopping.  Patient able to drink fluid, but forgot that she has liquid in mouth.  Patient did not swallow until prompted by RN. Attempted swallow eval challenge again, patient able to swallow, but pauses for several seconds before swallowing between sips.

## 2018-11-12 NOTE — Progress Notes (Signed)
  Date: 11/12/2018  Patient name: Berenis Corter  Medical record number: [id number]  Date of birth: 19-Sep-1945   I have seen and evaluated Jonah Blue and discussed their care with the Residency Team.  In brief, patient is 73 year old female with unknown past medical history who presented to the ED with altered mental status x1 day.  EMS was called out to the patient's home on the day prior to her admission for assistance with getting onto her couch.  Patient stated at the time that she not want to go to the hospital.  Yesterday, they were called out again for a fall.  Upon arrival, EMS noted the patient was on couch and was soaked with urine.  Patient had told him that she not eaten or drank since the day before.  He was also noted left-sided drooping and slurred speech and that the patient was tachycardic, tachypneic and febrile.  She was brought to the hospital for further evaluation.  Patient unable to provide a good history at this time but states that she is feeling better today.  No chest pain, shortness of breath, no palpitations, no lightheadedness, no syncope, no nausea or vomiting, no diarrhea, no abdominal pain.  Today patient states that she feels better and is oriented x3.  She has no complaints currently except wanting to eat.  PMHx, Fam Hx, and/or Soc Hx : As per resident note  Vitals:   11/12/18 1100 11/12/18 1422  BP: (!) 109/58 122/64  Pulse:  85  Resp:  (!) 21  Temp: 98.7 F (37.1 C) 98.7 F (37.1 C)  SpO2:  97%   General: Awake, alert, x3, NAD CVs: Regular rate and rhythm, normal heart sounds Lungs: Bilateral expiratory wheeze noted Abdomen: Soft, nontender, nondistended, normoactive bowel sounds Extremities: No edema noted HEENT: Normocephalic, atraumatic Psych: Normal mood and affect Neuro: Oriented x3, no focal deficit noted  Assessment and Plan: I have seen and evaluated the patient as outlined above. I agree with the formulated Assessment and Plan as detailed  in the residents' note, with the following changes:   1.  Sepsis secondary to UTI: -Patient presented to the ED with altered mental status and was found to have sepsis secondary to UTI. -Patient noted to have E. coli bacteremia as well as E. coli in the urine.  Will await sensitivities -Continue ceftriaxone 2 g every 24 hours for now -Leukocytosis and lactic acidosis have resolved and her mentation is improving.  We will continue to monitor closely -Patient also was noted to have an AKI on admission with admission creatinine of 1.5 which was likely prerenal in nature.  Her creatinine is now improved to 1.02.  We will continue to monitor -Of note, patient was also noted to be wheezing on exam today.  It is unknown what her past medical history is and we will need to speak to the patient's family to see if she is on any medications at home.  We will continue with duo nebs for now  Aldine Contes, MD 8/2/20205:17 PM

## 2018-11-13 LAB — CBC WITH DIFFERENTIAL/PLATELET
Abs Immature Granulocytes: 0.32 10*3/uL — ABNORMAL HIGH (ref 0.00–0.07)
Basophils Absolute: 0 10*3/uL (ref 0.0–0.1)
Basophils Relative: 1 %
Eosinophils Absolute: 0.1 10*3/uL (ref 0.0–0.5)
Eosinophils Relative: 3 %
HCT: 33.1 % — ABNORMAL LOW (ref 36.0–46.0)
Hemoglobin: 10.3 g/dL — ABNORMAL LOW (ref 12.0–15.0)
Immature Granulocytes: 7 %
Lymphocytes Relative: 27 %
Lymphs Abs: 1.3 10*3/uL (ref 0.7–4.0)
MCH: 25.5 pg — ABNORMAL LOW (ref 26.0–34.0)
MCHC: 31.1 g/dL (ref 30.0–36.0)
MCV: 81.9 fL (ref 80.0–100.0)
Monocytes Absolute: 0.5 10*3/uL (ref 0.1–1.0)
Monocytes Relative: 10 %
Neutro Abs: 2.6 10*3/uL (ref 1.7–7.7)
Neutrophils Relative %: 52 %
Platelets: 180 10*3/uL (ref 150–400)
RBC: 4.04 MIL/uL (ref 3.87–5.11)
RDW: 14.7 % (ref 11.5–15.5)
WBC: 4.9 10*3/uL (ref 4.0–10.5)
nRBC: 0 % (ref 0.0–0.2)

## 2018-11-13 LAB — CULTURE, BLOOD (ROUTINE X 2): Special Requests: ADEQUATE

## 2018-11-13 LAB — BASIC METABOLIC PANEL
Anion gap: 9 (ref 5–15)
BUN: 13 mg/dL (ref 8–23)
CO2: 17 mmol/L — ABNORMAL LOW (ref 22–32)
Calcium: 8 mg/dL — ABNORMAL LOW (ref 8.9–10.3)
Chloride: 112 mmol/L — ABNORMAL HIGH (ref 98–111)
Creatinine, Ser: 1.01 mg/dL — ABNORMAL HIGH (ref 0.44–1.00)
GFR calc Af Amer: >60 mL/min (ref >60)
GFR calc non Af Amer: 54 mL/min — ABNORMAL LOW (ref >60)
Glucose, Bld: 116 mg/dL — ABNORMAL HIGH (ref 70–99)
Potassium: 3.6 mmol/L (ref 3.5–5.1)
Sodium: 138 mmol/L (ref 135–145)

## 2018-11-13 LAB — URINE CULTURE: Culture: 10000 — AB

## 2018-11-13 MED ORDER — SODIUM CHLORIDE 0.9 % IV SOLN
INTRAVENOUS | Status: DC | PRN
  Administered 2018-11-13 – 2018-11-14 (×2): 250 mL via INTRAVENOUS

## 2018-11-13 NOTE — Progress Notes (Signed)
   Subjective: Patient feeling well today. She would like to go home. We discussed that we are waiting for her to work with physical therapy and occupational therapy to determine whether she needs to go to rehab or have home health. She voices understanding. She states that we can call her daughter to discuss her current treatment plan. All questions and concerns addressed.  Objective: Vital signs in last 24 hours: Vitals:   11/13/18 0420 11/13/18 0619 11/13/18 0623 11/13/18 0805  BP:  122/65 139/66 113/66  Pulse:  65 63 72  Resp:  17 (!) 23 19  Temp: 98.7 F (37.1 C)   98.4 F (36.9 C)  TempSrc: Oral   Oral  SpO2:  98% 98% 99%  Height:       General: Well nourished female in no acute distress Pulm: Good air movement with diffuse wheezing CV: RRR, no murmurs, no rubs   Assessment/Plan:  Mariah May is a 73 year old female presented to the hospital with encephalopathy. She was found to be febrile tachycardic. Urine analysis was significant for hematuria, polyuria, and bacteria. Blood cultures and urine cultures have both grown out E. coli. She is currently being treated with IV ceftriaxone.  E. coli bacteremia Complicated urinary tract infection - Mental status significantly improved. - Afebrile since admission - Leukocytosis down trending. - Will coordinate care with the patient's daughter - Awaiting PT/OT evaluation - Continue IV ceftriaxone while here. Plan to switch over to PO Cefdinir for 7-14 days on discharge  AKI. Resolved  Dispo: Anticipated discharge in approximately 1-2 day(s), pending PT/OT eval and conversation with daughter.   Mariah Homes, MD 11/13/2018, 10:16 AM Pager: My Pager: [phone]

## 2018-11-13 NOTE — Progress Notes (Signed)
Spoke with the patient's daughter, Madilyn Hook [phone]). She would like her mother to go to rehab prior to coming home. The patient lives alone and does not have any support. The overall plan is for the patient to move back in [place].   OT is recommending SNF so I have consulted social work for placement.   Ina Homes, MD  [facility] PGY3 Pager: [phone]

## 2018-11-13 NOTE — Evaluation (Signed)
Clinical/Bedside Swallow Evaluation Patient Details  Name: Mariah May MRN: [id number] Date of Birth: 12-08-1945  Today's Date: 11/13/2018 Time: SLP Start Time (ACUTE ONLY): 1001 SLP Stop Time (ACUTE ONLY): 1023 SLP Time Calculation (min) (ACUTE ONLY): 22 min  Past Medical History: No past medical history on file. Past Surgical History: unknown HPI:  [name] is a 73 year old female presented to the hospital with encephalopathy. She was found to be febrile tachycardic. Urine analysis was significant for hematuria, polyuria, and bacteria. Blood cultures and urine cultures have both grown out E. coli.  Pt has hx of prior CVA.   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  Pt tolerated thin liquids and puree with no clinical s/s of aspiration.  Pt declined regular solid and soft solid trials.  Pt states that she cannot chew these consistencies 2/2 edentulism.  Pt reports eating soft foods (specifically rice and pasta) at home.  With simulated ground consistency, there was a single cough with anterior loss of secretions noted from L side of oral cavity.  Pt did not respond to questions about the source of cough.  Pt exhibited adequate oral clearance.  RN reported coughing with regular consistency.  Recommend ground/chopped diet with thin liquids. SLP Visit Diagnosis: Dysphagia, oropharyngeal phase (R13.12)    Aspiration Risk  Mild aspiration risk    Diet Recommendation Dysphagia 2 (Fine chop);Thin liquid   Liquid Administration via: Cup;Straw Medication Administration: (as tolerated) Supervision: Patient able to self feed Compensations: Minimize environmental distractions;Small sips/bites;Slow rate Postural Changes: Seated upright at 90 degrees    Other  Recommendations Oral Care Recommendations: Oral care BID   Follow up Recommendations (Continue ST at next  level of care)      Frequency and Duration min 1 x/week  2 weeks       Prognosis   Good     Swallow Study   General HPI: [name] is a 73 year old female presented to the hospital with encephalopathy. She was found to be febrile tachycardic. Urine analysis was significant for hematuria, polyuria, and bacteria. Blood cultures and urine cultures have both grown out E. coli.  Pt has hx of prior CVA. Type of Study: Bedside Swallow Evaluation Diet Prior to this Study: Regular;Thin liquids Temperature Spikes Noted: No History of Recent Intubation: No Behavior/Cognition: Alert;Cooperative;Pleasant mood Oral Cavity - Dentition: Edentulous Self-Feeding Abilities: Able to feed self Patient Positioning: Upright in bed Baseline Vocal Quality: Normal Volitional Cough: Strong Volitional Swallow: Unable to elicit    Oral/Motor/Sensory Function Overall Oral Motor/Sensory Function: Mild impairment Facial ROM: Reduced left Facial Symmetry: Within Functional Limits Lingual ROM: Within Functional Limits Lingual Symmetry: Within Functional Limits Lingual Strength: Reduced Velum: Within Functional Limits Mandible: Within Functional Limits   Ice Chips Ice chips: Not tested   Thin Liquid Thin Liquid: Within functional limits Presentation: Cup;Straw    Nectar Thick Nectar Thick Liquid: Not tested   Honey Thick Honey Thick Liquid: Not tested   Puree Puree: Within functional limits Presentation: Spoon;Self Fed   Solid     Solid: Impaired Presentation: Self Fed;Spoon Oral Phase Functional Implications: Prolonged oral transit Pharyngeal Phase Impairments: Cough - Immediate      Mariah Savage, MA, [place] Office: [phone]; Pager (8/3): [phone] 11/13/2018,10:34 AM

## 2018-11-13 NOTE — Progress Notes (Signed)
Occupational Therapy Evaluation Patient Details Name: Mariah May MRN: [id number] DOB: [birth year] Today's Date: 11/13/2018    History of Present Illness Mariah May is a 73 year old female presented to the hospital with encephalopathy. She was found to be febrile tachycardic. Urine analysis was significant for hematuria, polyuria, and bacteria. Blood cultures and urine cultures have both grown out E. coli.  Pt has hx of prior CVA.    Clinical Impression   PTA, pt was living at home alone and reports she was independent with ADL/IADL and modified independent with functional mobility at RW level. Pt appears to be a poor historian due to cognitive limitations. Pt currently requires modA for bed mobility, maxA for attempt to stand, unable to progress to fully upright posture. Pt unaware she was soiled. Required modA for rolling and totalA for posterior pericare. Due to decline in current level of function, pt would benefit from acute OT to address established goals to facilitate safe D/C to venue listed below. At this time, recommend SNF follow-up. Will continue to follow acutely.     Follow Up Recommendations  SNF    Equipment Recommendations  Other (comment)(defer to next venue)    Recommendations for Other Services PT consult     Precautions / Restrictions Precautions Precautions: Fall Restrictions Weight Bearing Restrictions: No      Mobility Bed Mobility Overal bed mobility: Needs Assistance Bed Mobility: Rolling;Sidelying to Sit;Sit to Sidelying Rolling: Mod assist;Max assist Sidelying to sit: Mod assist     Sit to sidelying: Mod assist General bed mobility comments: modA for initiating rolling, maxA to maintain roll for pericare completion;  Transfers Overall transfer level: Needs assistance Equipment used: Rolling walker (2 wheeled);1 person hand held assist Transfers: Sit to/from Stand Sit to Stand: Max assist         General transfer comment: maxA to powerup  into standing and maintain balance, pt unable to progress to fully upright standing;attempted 1x    Balance Overall balance assessment: Needs assistance Sitting-balance support: Feet supported;Single extremity supported Sitting balance-Leahy Scale: Poor Sitting balance - Comments: minguard-minA intermittent instability requiring minA Postural control: Left lateral lean Standing balance support: Bilateral upper extremity supported Standing balance-Leahy Scale: Zero Standing balance comment: unable to progress to fully upright posture                           ADL either performed or assessed with clinical judgement   ADL Overall ADL's : Needs assistance/impaired Eating/Feeding: Set up;Sitting   Grooming: Set up;Sitting Grooming Details (indicate cue type and reason): washed face sitting EOB Upper Body Bathing: Minimal assistance;Sitting   Lower Body Bathing: Maximal assistance;Sitting/lateral leans   Upper Body Dressing : Moderate assistance;Sitting   Lower Body Dressing: Maximal assistance;Sitting/lateral leans   Toilet Transfer: Total assistance;+2 for physical assistance Toilet Transfer Details (indicate cue type and reason): rolled in bed for pericare;1 person assist with roll, 1person assist with pericare Toileting- Clothing Manipulation and Hygiene: Total assistance Toileting - Clothing Manipulation Details (indicate cue type and reason): totalA for pericare       General ADL Comments: deferred functional mobiltiy this session due to safety;pt able to tolerate sitting EOB for about 60min with minguard-minA for support     Vision Patient Visual Report: No change from baseline       Perception     Praxis      Pertinent Vitals/Pain Pain Assessment: Faces Faces Pain Scale: Hurts even more Pain Location: RLE with movement;reports painful  due to arthritis Pain Descriptors / Indicators: Discomfort;Moaning Pain Intervention(s): Limited activity within  patient's tolerance;Monitored during session     Hand Dominance Right   Extremity/Trunk Assessment Upper Extremity Assessment Upper Extremity Assessment: Generalized weakness;LUE deficits/detail;RUE deficits/detail RUE Deficits / Details: grossly 3/5 RUE Sensation: WNL RUE Coordination: decreased gross motor;decreased fine motor LUE Deficits / Details: weaker than RUE;grossly 3-/5 LUE Sensation: WNL LUE Coordination: decreased fine motor;decreased gross motor   Lower Extremity Assessment Lower Extremity Assessment: Defer to PT evaluation;RLE deficits/detail RLE Deficits / Details: reports pain due to arthritis, unable to fully assess RLE Coordination: decreased fine motor   Cervical / Trunk Assessment Cervical / Trunk Assessment: Kyphotic   Communication Communication Communication: Expressive difficulties   Cognition Arousal/Alertness: Awake/alert Behavior During Therapy: WFL for tasks assessed/performed Overall Cognitive Status: No family/caregiver present to determine baseline cognitive functioning Area of Impairment: Attention;Memory;Safety/judgement;Following commands;Problem solving;Awareness                   Current Attention Level: Focused Memory: Decreased short-term memory Following Commands: Follows one step commands inconsistently Safety/Judgement: Decreased awareness of safety;Decreased awareness of deficits Awareness: Intellectual Problem Solving: Slow processing;Decreased initiation;Difficulty sequencing;Requires verbal cues;Requires tactile cues General Comments: pt soiled and unaware;pt required max multimodal cues for initiation and sequencing of tasks, pt oriented x4   General Comments  VSS throughout session    Exercises     Shoulder Instructions      Home Living Family/patient expects to be discharged to:: Private residence Living Arrangements: Alone Available Help at Discharge: Family;Other (Comment)(unsure availability ) Type of Home:  Mobile home             Bathroom Shower/Tub: Chief Strategy OfficerTub/shower unit   Bathroom Toilet: Standard         Additional Comments: pt poor historian, decreased attention and at times did not answer all questions;unable to reach family to verify at this time;pt [place]      Prior Functioning/Environment Level of Independence: Independent        Comments: pt reported she was independent with all ADL and functional mobilty with use of RW/cane;        OT Problem List: Decreased strength;Decreased range of motion;Decreased activity tolerance;Impaired balance (sitting and/or standing);Decreased cognition;Decreased safety awareness;Decreased knowledge of precautions;Decreased knowledge of use of DME or AE;Pain;Obesity      OT Treatment/Interventions: Self-care/ADL training;Therapeutic exercise;Energy conservation;DME and/or AE instruction;Therapeutic activities;Cognitive remediation/compensation;Patient/family education;Balance training    OT Goals(Current goals can be found in the care plan section) Acute Rehab OT Goals Patient Stated Goal: to go home with her daughter OT Goal Formulation: With patient Time For Goal Achievement: 11/27/18 Potential to Achieve Goals: Good ADL Goals Pt Will Perform Grooming: with modified independence;sitting Pt Will Perform Upper Body Dressing: with modified independence;sitting Pt Will Perform Lower Body Dressing: with modified independence;with adaptive equipment;sit to/from stand Pt Will Transfer to Toilet: with modified independence;ambulating Pt Will Perform Toileting - Clothing Manipulation and hygiene: with modified independence;sit to/from stand  OT Frequency: Min 2X/week   Barriers to D/C: Decreased caregiver support  pt reports she lives alone       Co-evaluation              AM-PAC OT "6 Clicks" Daily Activity     Outcome Measure Help from another person eating meals?: A Little Help  from another person taking care of personal grooming?: A Little Help from another person toileting, which includes using toliet, bedpan, or urinal?:  Total Help from another person bathing (including washing, rinsing, drying)?: A Lot Help from another person to put on and taking off regular upper body clothing?: A Lot Help from another person to put on and taking off regular lower body clothing?: A Lot 6 Click Score: 13   End of Session Equipment Utilized During Treatment: Gait belt;Rolling walker Nurse Communication: Mobility status  Activity Tolerance: Patient tolerated treatment well Patient left: in bed;with call bell/phone within reach;with bed alarm set  OT Visit Diagnosis: Unsteadiness on feet (R26.81);Other abnormalities of gait and mobility (R26.89);Muscle weakness (generalized) (M62.81);History of falling (Z91.81);Feeding difficulties (R63.3);Pain Pain - Right/Left: Right Pain - part of body: Leg                Time: [id number] OT Time Calculation (min): 41 min Charges:  OT General Charges $OT Visit: 1 Visit OT Evaluation $OT Eval Moderate Complexity: 1 Mod OT Treatments $Self Care/Home Management : 23-37 mins  Mariah May OTR/L Acute Rehabilitation Services Office: [phone]   Rebeca Alerteresa J Eeva Schlosser 11/13/2018, 11:08 AM

## 2018-11-13 NOTE — Progress Notes (Signed)
Internal Medicine Attending:   I saw and examined the patient. I reviewed DrHelberg's note and I agree with the resident's findings and plan as documented in the resident's note. Overall doing very well, wants to be discharged home. Given rapid improvement will be reasonable to change to oral antibiotics once she is discharged home, will discuss with daughter to make sure stable home environment for transition home likely with home health follow up.

## 2018-11-13 NOTE — Progress Notes (Signed)
PT Cancellation Note  Patient Details Name: Mariah May MRN: [id number] DOB: 02-17-1946   Cancelled Treatment:    Reason Eval/Treat Not Completed: Patient declined, no reason specified.  Agreed to do the eval tomorrow.   Ramond Dial 11/13/2018, 3:31 PM   Mee Hives, PT MS Acute Rehab Dept. Number: [place]

## 2018-11-14 DIAGNOSIS — R5381 Other malaise: Secondary | ICD-10-CM

## 2018-11-14 DIAGNOSIS — Z8673 Personal history of transient ischemic attack (TIA), and cerebral infarction without residual deficits: Secondary | ICD-10-CM

## 2018-11-14 DIAGNOSIS — R7881 Bacteremia: Secondary | ICD-10-CM

## 2018-11-14 DIAGNOSIS — N3001 Acute cystitis with hematuria: Secondary | ICD-10-CM

## 2018-11-14 DIAGNOSIS — B9620 Unspecified Escherichia coli [E. coli] as the cause of diseases classified elsewhere: Secondary | ICD-10-CM

## 2018-11-14 DIAGNOSIS — Z91041 Radiographic dye allergy status: Secondary | ICD-10-CM

## 2018-11-14 LAB — CBC WITH DIFFERENTIAL/PLATELET
Abs Immature Granulocytes: 0.27 10*3/uL — ABNORMAL HIGH (ref 0.00–0.07)
Basophils Absolute: 0.1 10*3/uL (ref 0.0–0.1)
Basophils Relative: 1 %
Eosinophils Absolute: 0.2 10*3/uL (ref 0.0–0.5)
Eosinophils Relative: 3 %
HCT: 33.4 % — ABNORMAL LOW (ref 36.0–46.0)
Hemoglobin: 10.4 g/dL — ABNORMAL LOW (ref 12.0–15.0)
Immature Granulocytes: 5 %
Lymphocytes Relative: 39 %
Lymphs Abs: 2.1 10*3/uL (ref 0.7–4.0)
MCH: 25.7 pg — ABNORMAL LOW (ref 26.0–34.0)
MCHC: 31.1 g/dL (ref 30.0–36.0)
MCV: 82.7 fL (ref 80.0–100.0)
Monocytes Absolute: 0.8 10*3/uL (ref 0.1–1.0)
Monocytes Relative: 14 %
Neutro Abs: 2.1 10*3/uL (ref 1.7–7.7)
Neutrophils Relative %: 38 %
Platelets: 190 10*3/uL (ref 150–400)
RBC: 4.04 MIL/uL (ref 3.87–5.11)
RDW: 14.9 % (ref 11.5–15.5)
WBC: 5.5 10*3/uL (ref 4.0–10.5)
nRBC: 0 % (ref 0.0–0.2)

## 2018-11-14 LAB — BASIC METABOLIC PANEL
Anion gap: 9 (ref 5–15)
BUN: 11 mg/dL (ref 8–23)
CO2: 21 mmol/L — ABNORMAL LOW (ref 22–32)
Calcium: 8.3 mg/dL — ABNORMAL LOW (ref 8.9–10.3)
Chloride: 110 mmol/L (ref 98–111)
Creatinine, Ser: 0.84 mg/dL (ref 0.44–1.00)
GFR calc Af Amer: >60 mL/min (ref >60)
GFR calc non Af Amer: >60 mL/min (ref >60)
Glucose, Bld: 96 mg/dL (ref 70–99)
Potassium: 4 mmol/L (ref 3.5–5.1)
Sodium: 140 mmol/L (ref 135–145)

## 2018-11-14 MED ORDER — SULFAMETHOXAZOLE-TRIMETHOPRIM 800-160 MG PO TABS: 1.0000 | ORAL_TABLET | Freq: Two times a day (BID) | ORAL | 0 refills | Status: AC

## 2018-11-14 MED ORDER — SULFAMETHOXAZOLE-TRIMETHOPRIM 800-160 MG PO TABS
1.0000 | ORAL_TABLET | Freq: Two times a day (BID) | ORAL | Status: DC
  Filled 2018-11-14: qty 1

## 2018-11-14 NOTE — Progress Notes (Addendum)
1618: Call to son to update on condition. Patient to be discharged today.   1820: Patient discharged home with personal belongings, 3n1 wheelchair/commode, and discharge paperwork. Discharge instructions provided.

## 2018-11-14 NOTE — Discharge Summary (Signed)
Name: Mariah May MRN: [id number] DOB: 19-Jun-1945 73 y.o. PCP: Patient, No Pcp Per  Date of Admission: 11/11/2018  5:31 AM Date of Discharge:  Attending Physician: Gust RungHoffman, Erik C, DO  Discharge Diagnosis: 1. E. Coli Bacteremia 2/2 UTI 2. Acute Kidney Injury   Discharge Medications: Allergies as of 11/14/2018      Reactions   Contrast Media [iodinated Diagnostic Agents]       Medication List    TAKE these medications   amLODipine 5 MG tablet Commonly known as: NORVASC Take 5 mg by mouth daily.   atorvastatin 40 MG tablet Commonly known as: LIPITOR Take 40 mg by mouth daily.   diclofenac sodium 1 % Gel Commonly known as: VOLTAREN Apply 2 g topically 4 (four) times daily as needed for pain.   sulfamethoxazole-trimethoprim 800-160 MG tablet Commonly known as: BACTRIM DS Take 1 tablet by mouth every 12 (twelve) hours for 10 days.            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  A cane, crutch or walker will not resolve issue with performing activities of daily living. A wheelchair will allow patient to safely perform daily activities. Patient can safely propel the wheelchair in the home or has a caregiver who can provide assistance. Length of need Lifetime. Accessories: elevating leg rests (ELRs), wheel locks, extensions and anti-tippers.   11/14/18 1557          Disposition and follow-up:   Ms.Tenicia Ardelle AntonWagoner [facility].  At the hospital follow up visit please address:  1.  Please ensure patient completed her course of  antibiotics.  Additionally, ensure patient safety at home as she had refused SNF.  2.  Labs / imaging needed at time of follow-up: CBC, BMP  3.  Pending labs/ test needing follow-up: None  Follow-up Appointments: Follow-up Information    [facility].   Specialty: Home Health Services Why: Home Health Services arranged (PT/OT/RN/Speech/Aide) Contact information: [address] [place] [address] [phone]        [facility] Follow up.   Why: Wheelchair and 3in1 to be delivered to room prior to discharge.        Jackie Plumsei-Bonsu, George, MD. Schedule an appointment as soon as possible for a visit.   Specialty: Internal Medicine Why: Hospital follow up.  Contact information: [address] [place] [address] [phone]           Hospital Course by problem list: 1.  E. coli bacteremia secondary to complicated UTI Patient presented to the ED with complaints of altered mental status found to be septic with a fever, tachycardia, and tachypnea.  Lactic acid max was 3.4 but improved to 1.8 with fluid resuscitation. Urine cultures and blood cultures drawn in the ED grew E. coli only resistant to ampicillin and ampicillin/sulbactam.  Patient was started on ceftriaxone, in addition to receiving 1 dose of vancomycin and cefepime while in the ED.  Her altered mental  status improved significantly by the next day.  Her vital signs had stabilized with no more febrile episodes and resolution of leukocytosis.  Discharged with 10 days of Bactrim.  2.  Acute kidney injury: Patient's creatinine on admission was 1.59.  Her baseline was unknown as well as her past medical history.  Most likely cause was dehydration secondary to altered mental status and sepsis.  Creatinine had improved to 1.02 by the next day and continue to improve by discharge.   Discharge Vitals:   BP 112/67 (BP Location: Right Arm)   Pulse 93   Temp 98.7 F (37.1 C) (Oral)    Resp (!) 21   Ht 5\' 4"  (1.626 m)   Wt 76.7 kg   LMP  (LMP Unknown)   SpO2 100%   BMI 29.02 kg/m   Pertinent Labs, Studies, and Procedures:   CBC Latest Ref Rng & Units 11/14/2018 11/13/2018 11/12/2018  WBC 4.0 - 10.5 K/uL 5.5 4.9 7.3  Hemoglobin 12.0 - 15.0 g/dL 10.4(L) 10.3(L) 10.5(L)  Hematocrit 36.0 - 46.0 % 33.4(L) 33.1(L) 33.4(L)  Platelets 150 - 400 K/uL 190 180 191     BMP Latest Ref Rng & Units 11/14/2018 11/13/2018 11/12/2018  Glucose 70 - 99 mg/dL 96 116(H) 114(H)  BUN 8 - 23 mg/dL 11 13 17   Creatinine 0.44 - 1.00 mg/dL 0.84 1.01(H) 1.02(H)  Sodium 135 - 145 mmol/L 140 138 140  Potassium 3.5 - 5.1 mmol/L 4.0 3.6 3.2(L)  Chloride 98 - 111 mmol/L 110 112(H) 113(H)  CO2 22 - 32 mmol/L 21(L) 17(L) 18(L)  Calcium 8.9 - 10.3 mg/dL 8.3(L) 8.0(L) 8.1(L)   Troponin (high-sensitivity): 9 CK: 1535  UA: 11/11/2018 Large hemoglobin Negative ketones Moderate leukocytes Positive nitrite 100 protein Many bacteria Present hyaline casts Present mucus 21-50 white blood cells   Right shoulder x-ray: IMPRESSION: Limited exam secondary to difficulty with patient positioning. No evidence for displaced fracture or dislocation.  Chest x-ray: IMPRESSION: No acute cardiopulmonary process.  CT head without contrast: IMPRESSION: 1. Advanced chronic small vessel ischemia with age indeterminate changes in the bilateral thalami and white matter. 2. No acute cortically based infarct or acute intracranial hemorrhage identified. ASPECTS 10. 3. No acute traumatic injury identified in the head or cervical Spine.    Discharge Instructions: Discharge Instructions    Call MD for:  persistant dizziness or light-headedness   Complete by: As directed    Call MD for:  persistant nausea and vomiting   Complete by: As directed    Call MD for:  temperature >100.4   Complete by: As directed    Diet - low sodium heart healthy   Complete by: As directed    Discharge instructions   Complete by: As  directed    Hello,   You were admitted to the hospital for a complicated urinary tract infection that spread to the blood stream. We are discharging you with 10 days of Bactrim, a antibiotic you will take two times per day. Please make sure to complete the entire 10 days course.   Follow up with your PCP in the next week.   Increase activity slowly   Complete by: As directed       Signed:  Dr. Jose Persia Internal Medicine PGY-1  Pager: [phone] 11/14/2018, 6:38 PM

## 2018-11-14 NOTE — Progress Notes (Signed)
   Subjective:   No acute complaints overnight. Very eager to go home. She is not willing to go to a SNF but will be willing to do [facility].   Objective:  Vital signs in last 24 hours: Vitals:   11/13/18 1926 11/13/18 2008 11/14/18 0035 11/14/18 0521  BP:  132/70 128/66 119/65  Pulse:  71 80 67  Resp:  (!) 24 (!) 22 (!) 24  Temp:  98.9 F (37.2 C) 97.9 F (36.6 C) 98.5 F (36.9 C)  TempSrc:  Oral Oral Oral  SpO2:  100% 99% 100%  Weight: 76.7 kg     Height:       Physical Exam Vitals signs and nursing note reviewed.  Constitutional:      General: She is not in acute distress.    Appearance: She is obese.  Cardiovascular:     Rate and Rhythm: Normal rate and regular rhythm.     Heart sounds: No murmur. No gallop.   Pulmonary:     Effort: Pulmonary effort is normal. No respiratory distress.     Breath sounds: Normal breath sounds. No wheezing or rales.  Abdominal:     General: Bowel sounds are normal. There is no distension.     Palpations: Abdomen is soft.     Tenderness: There is no guarding.  Skin:    General: Skin is warm and dry.  Neurological:     Mental Status: She is alert. Mental status is at baseline.     Assessment/Plan:  Principal Problem:   Sepsis secondary to UTI [facility]) Active Problems:   E. coli bacteremia  #E. coli bacteremia #Complicated urinary tract infection Mrs. Watton continues to improve today. No febrile events. Leukocytosis continues to be resolved. Switched to oral Bactrim to continue at home   Patient is adamant she does not want to go to a SNF. Amenable to [facility] and daughter will come live with her.   - Discharge with Bactrim 800-160 mg BID for 10 days.     Dispo: Anticipated discharge today.  Dr. Jose Persia Internal Medicine PGY-1  Pager: [phone] 11/14/2018, 6:58 AM

## 2018-11-14 NOTE — Evaluation (Signed)
Physical Therapy Evaluation Patient Details Name: Mariah May MRN: [id number] DOB: January 09, 1946 Today's Date: 11/14/2018   History of Present Illness  Pt is a 73 year old female admitted 11/11/18 with encephalopathy; pt found to be febrile tachycardic. Urine analysis was significant for hematuria, polyuria, and bacteria. Blood cultures and urine cultures have both grown out E. coli. PMH includes CVA.    Clinical Impression  Pt presents with an overall decrease in functional mobility secondary to above. PTA, pt lives alone and ambulatory with walker or cane. Pt currently requiring modA+2 to take steps to recliner with RW; bladder/bowel incontinence, dependent for pericare. Pt poor historian; spoke with son [facility](Antonio) on phone to verify home set-up and DME needs; pt's children plan [facility], currently declining SNF recommendation. Pt would benefit from continued acute PT services to maximize functional mobility and independence prior to d/c home.     Follow Up Recommendations Home health PT;Supervision/Assistance - 24 hour(declined SNF, family to provide 24/7)    Equipment Recommendations  3in1 (PT);Wheelchair (measurements PT);Wheelchair cushion (measurements PT)    Recommendations for Other Services       Precautions / Restrictions Precautions Precautions: Fall;Other (comment) Precaution Comments: Urine/bowel incontinence Restrictions Weight Bearing Restrictions: No      Mobility  Bed Mobility Overal bed mobility: Needs Assistance Bed Mobility: Rolling;Supine to Sit Rolling: Min assist;Mod assist   Supine to sit: Mod assist;HOB elevated     General bed mobility comments: ModA to roll to L-side, minA to R-side; reliant on use of bed rails to pull with UEs. ModA to assist sitting  Transfers Overall transfer level: Needs assistance Equipment used: Rolling walker (2 wheeled) Transfers: Sit to/from Stand Sit to Stand: Mod assist;+2 physical  assistance         General transfer comment: Able to stand from bed and recliner with modA+2 to assist trunk elevation and cue upright posture; pt with posterior lean, somewhat able to correct when initiating ambulation  Ambulation/Gait Ambulation/Gait assistance: Mod assist;+2 safety/equipment Gait Distance (Feet): 2 Feet Assistive device: Rolling walker (2 wheeled)   Gait velocity: Decreased Gait velocity interpretation: <1.31 ft/sec, indicative of household ambulator General Gait Details: Pivotal steps from bed to recliner with modA+2 and RW; assist to maintain upright posture, prevent posterior LOB and frequent cues for safety  Stairs            Wheelchair Mobility    Modified Rankin (Stroke Patients Only)       Balance Overall balance assessment: Needs assistance Sitting-balance support: Feet supported;Single extremity supported Sitting balance-Leahy Scale: Fair     Standing balance support: Bilateral upper extremity supported Standing balance-Leahy Scale: Poor Standing balance comment: Reliant on UE support and external assist                             Pertinent Vitals/Pain Pain Assessment: Faces Faces Pain Scale: Hurts little more Pain Location: Perianal area with pericare (skin tears noted) Pain Descriptors / Indicators: Discomfort;Guarding Pain Intervention(s): Monitored during session;Limited activity within patient's tolerance    Home Living Family/patient expects to be discharged to:: Private residence Living Arrangements: Alone Available Help at Discharge: Available 24 hours/day;Family Type of Home: Mobile home Home Access: Stairs to enter   Entergy CorporationEntrance Stairs-Number of Steps: 3 Home Layout: One level Home Equipment: Cane - single point;Grab bars - toilet;Grab bars - tub/shower Additional Comments: Pt poor historian, [place] Spoke with son [place](Antonio) on  phone to verify home set-up information;  reports family will be able to bump w/c up steps into home. Son and daughter plan to provide 24/7 assist    Prior Function Level of Independence: Independent with assistive device(s)         Comments: Pt inconsistent historian; reports mod indep walking with RW/SPC, indep with ADLs     Hand Dominance        Extremity/Trunk Assessment   Upper Extremity Assessment Upper Extremity Assessment: Generalized weakness;Defer to OT evaluation    Lower Extremity Assessment Lower Extremity Assessment: Generalized weakness    Cervical / Trunk Assessment Cervical / Trunk Assessment: Kyphotic  Communication   Communication: Expressive difficulties  Cognition Arousal/Alertness: Awake/alert Behavior During Therapy: WFL for tasks assessed/performed Overall Cognitive Status: No family/caregiver present to determine baseline cognitive functioning Area of Impairment: Attention;Memory;Safety/judgement;Following commands;Problem solving;Awareness                   Current Attention Level: Sustained Memory: Decreased short-term memory Following Commands: Follows one step commands inconsistently Safety/Judgement: Decreased awareness of safety;Decreased awareness of deficits Awareness: Intellectual Problem Solving: Slow processing;Decreased initiation;Difficulty sequencing;Requires verbal cues;Requires tactile cues General Comments: Pt unaware of incontinence and bed soiled; following simple commands. Decreased attention requiring frequent redirection in conversation and to task. Perseverating on going home today      General Comments      Exercises     Assessment/Plan    PT Assessment Patient needs continued PT services  PT Problem List Decreased strength;Decreased activity tolerance;Decreased balance;Decreased mobility;Decreased knowledge of use of DME;Decreased cognition       PT Treatment Interventions DME instruction;Gait training;Stair training;Functional mobility  training;Therapeutic activities;Therapeutic exercise;Balance training;Patient/family education;Wheelchair mobility training    PT Goals (Current goals can be found in the Care Plan section)  Acute Rehab PT Goals Patient Stated Goal: Go home with family and HH services PT Goal Formulation: With patient/family Time For Goal Achievement: 11/28/18 Potential to Achieve Goals: Good    Frequency Min 3X/week   Barriers to discharge        Co-evaluation PT/OT/SLP Co-Evaluation/Treatment: Yes Reason for Co-Treatment: For patient/therapist safety;To address functional/ADL transfers;Necessary to address cognition/behavior during functional activity PT goals addressed during session: Mobility/safety with mobility;Balance;Proper use of DME         AM-PAC PT "6 Clicks" Mobility  Outcome Measure Help needed turning from your back to your side while in a flat bed without using bedrails?: A Lot Help needed moving from lying on your back to sitting on the side of a flat bed without using bedrails?: A Lot Help needed moving to and from a bed to a chair (including a wheelchair)?: A Lot Help needed standing up from a chair using your arms (e.g., wheelchair or bedside chair)?: A Lot Help needed to walk in hospital room?: A Lot Help needed climbing 3-5 steps with a railing? : A Lot 6 Click Score: 12    End of Session Equipment Utilized During Treatment: Gait belt Activity Tolerance: Patient tolerated treatment well;Patient limited by fatigue Patient left: in chair;with call bell/phone within reach;with chair alarm set Nurse Communication: Mobility status PT Visit Diagnosis: Other abnormalities of gait and mobility (R26.89);Muscle weakness (generalized) (M62.81)    Time: 0865-7846 PT Time Calculation (min) (ACUTE ONLY): 26 min   Charges:   PT Evaluation $PT Eval Moderate Complexity: [place], PT, DPT Acute Rehabilitation Services  Pager [phone] Office  [place] 11/14/2018, 1:52 PM

## 2018-11-14 NOTE — Progress Notes (Deleted)
1618: Call to son, updated on condition. Patient to be discharged with foley.  1620: Went to assess foley. Foley coming out urethra. Foley removed. Patient daughter wants to speak with physician before inserting a new foley. Wants to know why patient has to discharge with foley. Page to MD.

## 2018-11-14 NOTE — Progress Notes (Signed)
Occupational Therapy Treatment Patient Details Name: Richard MiuHelen Tarazon MRN: [id number] DOB: [birth year] Today's Date: 11/14/2018    History of present illness Pt is a 73 year old female admitted 11/11/18 with encephalopathy; pt found to be febrile tachycardic. Urine analysis was significant for hematuria, polyuria, and bacteria. Blood cultures and urine cultures have both grown out E. coli. PMH includes CVA.   OT comments  Pt progressing toward established goals. Pt incontinent of bowel and bladder upon arrival, required totalA for pericare. Pt required modA+2 for functional mobility at RW level. Spoke with pt's son, Antonio via phone [phone], to verify home setup and availability of assistance at d/c. Antonio reports he and his sister will provide 24/7 assistance, requesting HH services as opposed to SNF. Pt will continue to benefit from skilled OT services to maximize safety and independence with ADL/IADL and functional mobility. Will continue to follow acutely and progress as tolerated.     Follow Up Recommendations  Home health OT;Supervision/Assistance - 24 hour    Equipment Recommendations  3 in 1 bedside commode    Recommendations for Other Services      Precautions / Restrictions Precautions Precautions: Fall;Other (comment) Precaution Comments: Urine/bowel incontinence Restrictions Weight Bearing Restrictions: No       Mobility Bed Mobility Overal bed mobility: Needs Assistance Bed Mobility: Rolling;Supine to Sit Rolling: Min assist;Mod assist Sidelying to sit: Mod assist Supine to sit: Mod assist;HOB elevated   Sit to sidelying: Mod assist General bed mobility comments: ModA to roll to L-side, minA to R-side; reliant on use of bed rails to pull with UEs. ModA to assist sitting  Transfers Overall transfer level: Needs assistance Equipment used: Rolling walker (2 wheeled) Transfers: Sit to/from UGI CorporationStand;Stand Pivot Transfers Sit to Stand: Mod assist;+2 physical  assistance Stand pivot transfers: Mod assist;+2 physical assistance       General transfer comment: Able to stand from bed and recliner with modA+2 to assist trunk elevation and cue upright posture; pt with posterior lean, somewhat able to correct when initiating ambulation    Balance Overall balance assessment: Needs assistance Sitting-balance support: Feet supported;Single extremity supported Sitting balance-Leahy Scale: Fair Sitting balance - Comments: minguard-minA intermittent instability requiring minA Postural control: Left lateral lean Standing balance support: Bilateral upper extremity supported Standing balance-Leahy Scale: Poor Standing balance comment: Reliant on UE support and external assist                           ADL either performed or assessed with clinical judgement   ADL Overall ADL's : Needs assistance/impaired Eating/Feeding: Set up;Sitting                   Lower Body Dressing: Maximal assistance;Sitting/lateral leans Lower Body Dressing Details (indicate cue type and reason): assistance to adjust her socks Toilet Transfer: Moderate assistance;+2 for physical assistance;Stand-pivot;RW Toilet Transfer Details (indicate cue type and reason): simulated from EOB to recliner Toileting- Clothing Manipulation and Hygiene: Total assistance Toileting - Clothing Manipulation Details (indicate cue type and reason): totalA for pericare;pt incontinent with bowel and urine upon arrival     Functional mobility during ADLs: Moderate assistance;+2 for physical assistance;Rolling walker General ADL Comments: decreased activity tolerance, generalized weakness,      Vision       Perception     Praxis      Cognition Arousal/Alertness: Awake/alert Behavior During Therapy: WFL for tasks assessed/performed Overall Cognitive Status: No family/caregiver present to determine baseline cognitive functioning Area of Impairment:  Attention;Memory;Safety/judgement;Following  commands;Problem solving;Awareness                   Current Attention Level: Sustained Memory: Decreased short-term memory Following Commands: Follows one step commands inconsistently Safety/Judgement: Decreased awareness of safety;Decreased awareness of deficits Awareness: Intellectual Problem Solving: Slow processing;Decreased initiation;Difficulty sequencing;Requires verbal cues;Requires tactile cues General Comments: Pt unaware of incontinence and bed soiled; following simple commands. Decreased attention requiring frequent redirection in conversation and to task. Perseverating on going home today        Exercises     Shoulder Instructions       General Comments VSS     Pertinent Vitals/ Pain       Pain Assessment: Faces Faces Pain Scale: Hurts little more Pain Location: Perianal area with pericare (skin tears noted) Pain Descriptors / Indicators: Discomfort;Guarding Pain Intervention(s): Monitored during session  Home Living Family/patient expects to be discharged to:: Private residence Living Arrangements: Alone Available Help at Discharge: Available 24 hours/day;Family Type of Home: Mobile home Home Access: Stairs to enter Secretary/administratorntrance Stairs-Number of Steps: 3   Home Layout: One level     Bathroom Shower/Tub: Chief Strategy OfficerTub/shower unit   Bathroom Toilet: Standard     Home Equipment: Cane - single point;Grab bars - toilet;Grab bars - tub/shower   Additional Comments: Pt poor historian, [place]. Spoke with son [place](Antonio) on phone to verify home set-up information; reports family will be able to bump w/c up steps into home. Son and daughter plan to provide 24/7 assist      Prior Functioning/Environment Level of Independence: Independent with assistive device(s)        Comments: Pt inconsistent historian; reports mod indep walking with RW/SPC, indep with ADLs   Frequency  Min 2X/week         Progress Toward Goals  OT Goals(current goals can now be found in the care plan section)  Progress towards OT goals: Progressing toward goals  Acute Rehab OT Goals Patient Stated Goal: Go home with family and HH services OT Goal Formulation: With patient Time For Goal Achievement: 11/27/18 Potential to Achieve Goals: Good ADL Goals Pt Will Perform Grooming: with modified independence;sitting Pt Will Perform Upper Body Dressing: with modified independence;sitting Pt Will Perform Lower Body Dressing: with modified independence;with adaptive equipment;sit to/from stand Pt Will Transfer to Toilet: with modified independence;ambulating Pt Will Perform Toileting - Clothing Manipulation and hygiene: with modified independence;sit to/from stand  Plan Discharge plan needs to be updated    Co-evaluation    PT/OT/SLP Co-Evaluation/Treatment: Yes Reason for Co-Treatment: For patient/therapist safety;To address functional/ADL transfers;Complexity of the patient's impairments (multi-system involvement);Necessary to address cognition/behavior during functional activity PT goals addressed during session: Mobility/safety with mobility;Balance;Proper use of DME OT goals addressed during session: ADL's and self-care      AM-PAC OT "6 Clicks" Daily Activity     Outcome Measure   Help from another person eating meals?: A Little Help from another person taking care of personal grooming?: A Little Help from another person toileting, which includes using toliet, bedpan, or urinal?: A Lot Help from another person bathing (including washing, rinsing, drying)?: A Lot Help from another person to put on and taking off regular upper body clothing?: A Lot Help from another person to put on and taking off regular lower body clothing?: A Lot 6 Click Score: 14    End of Session Equipment Utilized During Treatment: Gait belt;Rolling walker  OT Visit Diagnosis: Unsteadiness on feet (R26.81);Other  abnormalities of gait and  mobility (R26.89);Muscle weakness (generalized) (M62.81);History of falling (Z91.81);Feeding difficulties (R63.3);Pain Pain - Right/Left: Right Pain - part of body: Leg   Activity Tolerance Patient tolerated treatment well   Patient Left with call bell/phone within reach;in chair;with chair alarm set   Nurse Communication Mobility status        Time: 6720-9198 OT Time Calculation (min): 27 min  Charges: OT General Charges $OT Visit: 1 Visit OT Treatments $Self Care/Home Management : 8-22 mins  Dorinda Hill OTR/L [place] Office: [place] 11/14/2018, 1:56 PM

## 2018-11-14 NOTE — Discharge Instructions (Signed)
Sepsis, Diagnosis, Adult °Sepsis is a serious bodily reaction to an infection. The infection that triggers sepsis may be from a bacteria, virus, or fungus. Sepsis can result from an infection in any part of your body. Infections that commonly lead to sepsis include skin, lung, and urinary tract infections. °Sepsis is a medical emergency that must be treated right away in a hospital. In severe cases, it can lead to septic shock. Septic shock can weaken your heart and cause your blood pressure to drop. This can cause your central nervous system and your body's organs to stop working. °What are the causes? °This condition is caused by a severe reaction to infections from bacteria, viruses, or fungus. The germs that most often lead to sepsis include: °· Escherichia coli (E. coli) bacteria. °· Staphylococcus aureus (staph) bacteria. °· Some types of Streptococcus bacteria. °The most common infections affect these organs: °· The lung (pneumonia). °· The kidneys or bladder (urinary tract infection). °· The skin (cellulitis). °· The bowel, gallbladder, or pancreas. °What increases the risk? °You are more likely to develop this condition if: °· Your body's disease-fighting system (immune system) is weakened. °· You are age 65 or older. °· You are female. °· You had surgery or you have been hospitalized. °· You have these devices inserted into your body: °? A small, thin tube (catheter). °? IV line. °? Breathing tube. °? Drainage tube. °· You are not getting enough nutrients from food (malnourished). °· You have a long-term (chronic) disease, such as cancer, lung disease, kidney disease, or diabetes. °· You are African American. °What are the signs or symptoms? °Symptoms of this condition may include: °· Fever. °· Chills or feeling very cold. °· Confusion or anxiety. °· Fatigue. °· Muscle aches. °· Shortness of breath. °· Nausea and vomiting. °· Urinating much less than usual. °· Fast heart rate (tachycardia). °· Rapid  breathing (hyperventilation). °· Changes in skin color. Your skin may look blotchy, pale, or blue. °· Cool, clammy, or sweaty skin. °· Skin rash. °Other symptoms depend on the source of your infection. °How is this diagnosed? °This condition is diagnosed based on: °· Your symptoms. °· Your medical history. °· A physical exam. °Other tests may also be done to find out the cause of the infection and how severe the sepsis is. These tests may include: °· Blood tests. °· Urine tests. °· Swabs from other areas of your body that may have an infection. These samples may be tested (cultured) to find out what type of bacteria is causing the infection. °· Chest X-ray to check for pneumonia. Other imaging tests, such as a CT scan, may also be done. °· Lumbar puncture. This removes a small amount of the fluid that surrounds your brain and spinal cord. The fluid is then examined for infection. °How is this treated? °This condition must be treated in a hospital. Based on the cause of your infection, you may be given an antibiotic, antiviral, or antifungal medicine. °You may also receive: °· Fluids through an IV. °· Oxygen and breathing assistance. °· Medicines to increase your blood pressure. °· Kidney dialysis. This process cleans your blood if your kidneys have failed. °· Surgery to remove infected tissue. °· Blood transfusion if needed. °· Medicine to prevent blood clots. °· Nutrients to correct imbalances in basic body function (metabolism). You may: °? Receive important salts and minerals (electrolytes) through an IV. °? Have your blood sugar level adjusted. °Follow these instructions at home: °Medicines ° °· Take over-the-counter and   prescription medicines only as told by your health care provider. °· If you were prescribed an antibiotic, antiviral, or antifungal medicine, take it as told by your health care provider. Do not stop taking the medicine even if you start to feel better. °General instructions °· If you have a  catheter or other indwelling device, ask to have it removed as soon as possible. °· Keep all follow-up visits as told by your health care provider. This is important. °Contact a health care provider if: °· You do not feel like you are getting better or regaining strength. °· You are having trouble coping with your recovery. °· You frequently feel tired. °· You feel worse or do not seem to get better after surgery. °· You think you may have an infection after surgery. °Get help right away if: °· You have any symptoms of sepsis. °· You have difficulty breathing. °· You have a rapid or skipping heartbeat. °· You become confused or disoriented. °· You have a high fever. °· Your skin becomes blotchy, pale, or blue. °· You have an infection that is getting worse or not getting better. °These symptoms may represent a serious problem that is an emergency. Do not wait to see if the symptoms will go away. Get medical help right away. Call your local emergency services (911 in the [place] Do not drive yourself to the hospital. °Summary °· Sepsis is a medical emergency that requires immediate treatment in a hospital. °· This condition is caused by a severe reaction to infections from bacteria, viruses, or fungus. °· Based on the cause of your infection, you may be given an antibiotic, antiviral, or antifungal medicine. °· Treatment may also include IV fluids, breathing assistance, and kidney dialysis. °This information is not intended to replace advice given to you by your health care provider. Make sure you discuss any questions you have with your health care provider. °Document Released: 12/26/2002 Document Revised: 11/04/2017 Document Reviewed: 11/04/2017 °Elsevier Patient Education © 2020 [employer] ° °

## 2018-11-14 NOTE — TOC Transition Note (Signed)
Transition of Care [facility]) - CM/SW Discharge Note   Patient Details  Name: Mariah May MRN: [id number] Date of Birth: 23-Nov-1945  [facility]) CM/SW Contact:  Benard Halsted, LCSW Phone Number: 11/14/2018, 4:33 PM   Clinical Narrative:    Patient able to discharge once 3in1 and wheelchair delivered to room. [facility] patient for home health.    Final next level of care: [place] Barriers to Discharge: No Barriers Identified   Patient Goals and CMS Choice Patient states their goals for this hospitalization and ongoing recovery are:: Return home CMS Medicare.gov Compare Post Acute Care list provided to:: Patient Represenative (must comment)(Son) Choice offered to / list presented to : Adult Children  Discharge Placement                Patient to be transferred to facility by: Car Name of family member notified: Son Patient and family notified of of transfer: 11/14/18  Discharge Plan and Services In-house Referral: Clinical Social Work Discharge Planning Services: [employer] Consult Post Acute Care Choice: Durable Medical Equipment, Home Health          DME Arranged: 3-N-1, Wheelchair manual DME Agency: [employer] Date DME Agency Contacted: 11/14/18 Time DME Agency Contacted: [phone] Representative spoke with at DME Agency: [place] Arranged: RN, PT, OT, Nurse's Aide, Speech Therapy [place] Agency: [place] Date [place]: 11/14/18 Time [place]: 1620 Representative spoke with at [place]: [place] ([place]) Interventions     Readmission Risk Interventions Readmission Risk Prevention Plan 11/14/2018  Post Dischage Appt Complete  Medication Screening Complete  Transportation Screening Complete

## 2018-11-14 NOTE — Progress Notes (Signed)
    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  A cane, crutch or walker will not resolve issue with performing activities of daily living. A wheelchair will allow patient to safely perform daily activities. Patient can safely propel the wheelchair in the home or has a caregiver who can provide assistance. Length of need Lifetime. Accessories: elevating leg rests (ELRs), wheel locks, extensions and anti-tippers.   11/14/18 1557

## 2018-11-14 NOTE — TOC Initial Note (Addendum)
Transition of Care [facility](TOC) - Initial/Assessment Note    Patient Details  Name: Mariah MiuHelen Vanterpool MRN: [id number] Date of Birth: May 12, 1945  Transition of Care [facility](TOC) CM/SW Contact:    Mearl LatinNadia S Florentine Diekman, LCSW Phone Number: 11/14/2018, 3:26 PM  Clinical Narrative:                 CSW received consult for possible home health services at time of discharge. CSW spoke with patient's son, Zoe Lanntonio, regarding PT recommendation of Home Health PT at time of discharge. Patient's son confirms that he is the decision maker and he has spoken with his sisters. They are refusing SNF placement and reported that he would like home health services ([facility]). CSW sent referral for review. CSW provided Medicare SNF ratings list. CSW discussed equipment needs and he requested a wheelchair and 3in1. CSW confirmed PCP (Dr. Jackie PlumGeorge Osei-Bonsu [phone]) and address with patient's son. Patient's son reports to contact him at discharge to arrange transport. No further questions reported at this time. CSW to continue to follow and assist with discharge planning needs.   Expected Discharge Plan: Home w Home Health Services Barriers to Discharge: Continued Medical Work up   Patient Goals and CMS Choice Patient states their goals for this hospitalization and ongoing recovery are:: Return home CMS Medicare.gov Compare Post Acute Care list provided to:: Patient Represenative (must comment)(Son) Choice offered to / list presented to : Adult Children  Expected Discharge Plan and Services Expected Discharge Plan: Home w Home Health Services In-house Referral: Clinical Social Work Discharge Planning Services: CM Consult Post Acute Care Choice: Durable Medical Equipment, Home Health Living arrangements for the past 2 months: Single Family Home                 DME Arranged: 3-N-1, Government social research officerWheelchair manual DME Agency: [employer]       HH Arranged: RN, PT, OT, Nurse's Aide, Speech Therapy          Prior Living  Arrangements/Services Living arrangements for the past 2 months: Single Family Home Lives with:: Adult Children Patient language and need for interpreter reviewed:: Yes Do you feel safe going back to the place where you live?: Yes      Need for Family Participation in Patient Care: Yes (Comment) Care giver support system in place?: Yes (comment) Current home services: DME Criminal Activity/Legal Involvement Pertinent to Current Situation/Hospitalization: No - Comment as needed  Activities of Daily Living Home Assistive Devices/Equipment: Grab bars around toilet, Grab bars in shower, Walker (specify type)(Four-wheel walker.) ADL Screening (condition at time of admission) Patient's cognitive ability adequate to safely complete daily activities?: Yes Is the patient deaf or have difficulty hearing?: Yes Does the patient have difficulty seeing, even when wearing glasses/contacts?: No Does the patient have difficulty concentrating, remembering, or making decisions?: Yes Patient able to express need for assistance with ADLs?: Yes Does the patient have difficulty dressing or bathing?: Yes Independently performs ADLs?: No Communication: Independent Dressing (OT): Needs assistance Is this a change from baseline?: Change from baseline, expected to last >3 days Grooming: Independent Feeding: Independent Bathing: Needs assistance Is this a change from baseline?: Change from baseline, expected to last >3 days Toileting: Needs assistance Is this a change from baseline?: Change from baseline, expected to last >3days In/Out Bed: Needs assistance Is this a change from baseline?: Change from baseline, expected to last >3 days Walks in Home: Needs assistance Is this a change from baseline?: Change from baseline, expected to last >3 days Does the patient have  difficulty walking or climbing stairs?: Yes Weakness of Legs: Both Weakness of Arms/Hands: Both  Permission Sought/Granted Permission sought  to share information with : Customer service manager, Family Supports Permission granted to share information with : Yes, Verbal Permission Granted  Share Information with NAME: Antonio  Permission granted to share info w AGENCY: [facility] granted to share info w Relationship: Son  Permission granted to share info w Contact Information: [phone]  Emotional Assessment Appearance:: Appears stated age Attitude/Demeanor/Rapport: Gracious Affect (typically observed): Appropriate Orientation: : Oriented to Self, Oriented to Place, Oriented to  Time, Oriented to Situation Alcohol / Substance Use: Not Applicable Psych Involvement: No (comment)  Admission diagnosis:  Tachycardia [R00.0] Acute cystitis with hematuria [N30.01] AKI (acute kidney injury) ([place]) [N17.9] Sepsis with acute renal failure without septic shock, due to unspecified organism, unspecified acute renal failure type ([place]) [A41.9, R65.20, N17.9] Patient Active Problem List   Diagnosis Date Noted  . E. coli bacteremia 11/12/2018  . Sepsis secondary to UTI ([place]) 11/11/2018   PCP:  Patient, No Pcp Per Pharmacy:   [facility] [place], [place] [address] Phone: [phone] Fax: [phone]     Social Determinants of Health (SDOH) Interventions    Readmission Risk Interventions Readmission Risk Prevention Plan 11/14/2018  Post Dischage Appt Complete  Medication Screening Complete  Transportation Screening Complete

## 2018-11-14 NOTE — Progress Notes (Signed)
Internal Medicine Attending:   I saw and examined the patient. I reviewed Dr Noni Saupe note and I agree with the resident's findings and plan as documented in the resident's note. Doing well agree with change to Bactrim for 10 days, patient is adamant about not going to SNF ("would rather die"), reports daughter is coming down to help her for a few days, Dr Charleen Kirks to reach out to daughter and confirm this. We will plan for [facility] PT.

## 2019-03-09 ENCOUNTER — Other Ambulatory Visit: Payer: Self-pay

## 2019-03-09 ENCOUNTER — Observation Stay (HOSPITAL_COMMUNITY)
Admission: EM | Admit: 2019-03-09 | Discharge: 2019-03-11 | Disposition: A | Discharge status: Home Health | Payer: Medicare Other | Attending: Student in an Organized Health Care Education/Training Program | Admitting: Student in an Organized Health Care Education/Training Program

## 2019-03-09 ENCOUNTER — Emergency Department (HOSPITAL_COMMUNITY): Payer: Medicare Other

## 2019-03-09 ENCOUNTER — Encounter (HOSPITAL_COMMUNITY): Payer: Self-pay | Admitting: Emergency Medicine

## 2019-03-09 DIAGNOSIS — R5381 Other malaise: Secondary | ICD-10-CM | POA: Diagnosis present

## 2019-03-09 DIAGNOSIS — E785 Hyperlipidemia, unspecified: Secondary | ICD-10-CM | POA: Diagnosis not present

## 2019-03-09 DIAGNOSIS — Z8673 Personal history of transient ischemic attack (TIA), and cerebral infarction without residual deficits: Secondary | ICD-10-CM | POA: Insufficient documentation

## 2019-03-09 DIAGNOSIS — R748 Abnormal levels of other serum enzymes: Secondary | ICD-10-CM | POA: Diagnosis present

## 2019-03-09 DIAGNOSIS — Z20828 Contact with and (suspected) exposure to other viral communicable diseases: Secondary | ICD-10-CM | POA: Diagnosis not present

## 2019-03-09 DIAGNOSIS — R2681 Unsteadiness on feet: Secondary | ICD-10-CM | POA: Insufficient documentation

## 2019-03-09 DIAGNOSIS — M6281 Muscle weakness (generalized): Secondary | ICD-10-CM | POA: Insufficient documentation

## 2019-03-09 DIAGNOSIS — M1990 Unspecified osteoarthritis, unspecified site: Secondary | ICD-10-CM | POA: Diagnosis not present

## 2019-03-09 DIAGNOSIS — B952 Enterococcus as the cause of diseases classified elsewhere: Secondary | ICD-10-CM | POA: Diagnosis not present

## 2019-03-09 DIAGNOSIS — R531 Weakness: Secondary | ICD-10-CM

## 2019-03-09 DIAGNOSIS — B9629 Other Escherichia coli [E. coli] as the cause of diseases classified elsewhere: Secondary | ICD-10-CM | POA: Diagnosis not present

## 2019-03-09 DIAGNOSIS — N390 Urinary tract infection, site not specified: Secondary | ICD-10-CM | POA: Diagnosis not present

## 2019-03-09 DIAGNOSIS — Z91041 Radiographic dye allergy status: Secondary | ICD-10-CM | POA: Diagnosis not present

## 2019-03-09 DIAGNOSIS — I10 Essential (primary) hypertension: Secondary | ICD-10-CM | POA: Insufficient documentation

## 2019-03-09 DIAGNOSIS — Z79899 Other long term (current) drug therapy: Secondary | ICD-10-CM | POA: Insufficient documentation

## 2019-03-09 DIAGNOSIS — N179 Acute kidney failure, unspecified: Principal | ICD-10-CM | POA: Diagnosis present

## 2019-03-09 HISTORY — DX: Cerebral infarction, unspecified: I63.9

## 2019-03-09 LAB — COMPREHENSIVE METABOLIC PANEL
ALT: 16 U/L (ref 0–44)
AST: 35 U/L (ref 15–41)
Albumin: 3.1 g/dL — ABNORMAL LOW (ref 3.5–5.0)
Alkaline Phosphatase: 107 U/L (ref 38–126)
Anion gap: 12 (ref 5–15)
BUN: 12 mg/dL (ref 8–23)
CO2: 22 mmol/L (ref 22–32)
Calcium: 8.9 mg/dL (ref 8.9–10.3)
Chloride: 106 mmol/L (ref 98–111)
Creatinine, Ser: 1.22 mg/dL — ABNORMAL HIGH (ref 0.44–1.00)
GFR calc Af Amer: 50 mL/min — ABNORMAL LOW (ref >60)
GFR calc non Af Amer: 43 mL/min — ABNORMAL LOW (ref >60)
Glucose, Bld: 102 mg/dL — ABNORMAL HIGH (ref 70–99)
Potassium: 4.1 mmol/L (ref 3.5–5.1)
Sodium: 140 mmol/L (ref 135–145)
Total Bilirubin: 1.4 mg/dL — ABNORMAL HIGH (ref 0.3–1.2)
Total Protein: 7.7 g/dL (ref 6.5–8.1)

## 2019-03-09 LAB — CBC WITH DIFFERENTIAL/PLATELET
Abs Immature Granulocytes: 0.03 10*3/uL (ref 0.00–0.07)
Basophils Absolute: 0.1 10*3/uL (ref 0.0–0.1)
Basophils Relative: 1 %
Eosinophils Absolute: 0.1 10*3/uL (ref 0.0–0.5)
Eosinophils Relative: 1 %
HCT: 37.1 % (ref 36.0–46.0)
Hemoglobin: 11.6 g/dL — ABNORMAL LOW (ref 12.0–15.0)
Immature Granulocytes: 0 %
Lymphocytes Relative: 21 %
Lymphs Abs: 1.6 10*3/uL (ref 0.7–4.0)
MCH: 26.6 pg (ref 26.0–34.0)
MCHC: 31.3 g/dL (ref 30.0–36.0)
MCV: 85.1 fL (ref 80.0–100.0)
Monocytes Absolute: 0.6 10*3/uL (ref 0.1–1.0)
Monocytes Relative: 8 %
Neutro Abs: 5.5 10*3/uL (ref 1.7–7.7)
Neutrophils Relative %: 69 %
Platelets: 319 10*3/uL (ref 150–400)
RBC: 4.36 MIL/uL (ref 3.87–5.11)
RDW: 14.1 % (ref 11.5–15.5)
WBC: 8 10*3/uL (ref 4.0–10.5)
nRBC: 0 % (ref 0.0–0.2)

## 2019-03-09 LAB — CBG MONITORING, ED: Glucose-Capillary: 113 mg/dL — ABNORMAL HIGH (ref 70–99)

## 2019-03-09 LAB — TROPONIN I (HIGH SENSITIVITY)
Troponin I (High Sensitivity): 10 ng/L (ref <18)
Troponin I (High Sensitivity): 8 ng/L (ref <18)

## 2019-03-09 LAB — CK: Total CK: 851 U/L — ABNORMAL HIGH (ref 38–234)

## 2019-03-09 MED ORDER — SODIUM CHLORIDE 0.9 % IV BOLUS
1000.0000 mL | Freq: Once | INTRAVENOUS | Status: AC
  Administered 2019-03-09: 20:00:00 1000 mL via INTRAVENOUS

## 2019-03-09 NOTE — ED Triage Notes (Signed)
Per [facility] pt coming from home c/o generalized weakness for a few days. Patient states history of stroke and every once in a while has difficulty ambulating. Uses a walker at home. Denies any pain. Alert and orientated x4.

## 2019-03-09 NOTE — ED Notes (Addendum)
Pt reminded urine sampled needed, she advised unable to provide sample at this time. She advised she was opposed to getting up to use restroom and straight cath. Purewick external catheter in place. Oral hydration provided.

## 2019-03-09 NOTE — ED Notes (Signed)
Pt given turkey sandwich and drink 

## 2019-03-09 NOTE — ED Notes (Signed)
Pt once again reminded about urine sample needed. Pt still unable to provide sample

## 2019-03-09 NOTE — ED Provider Notes (Signed)
[facility] EMERGENCY DEPARTMENT Provider Note   CSN: [id number] Arrival date & time: 03/09/19  1455     History   Chief Complaint Chief Complaint  Patient presents with  . Weakness    HPI Mariah May is a 73 y.o. female.     Patient is a 73 year old female who lives at home with her grandson who is presenting today with inability to walk.  Patient is a vague historian and gives few details but for at least the last 2 days she has been unable to get out of bed.  After speaking with her daughter she is normally able to get up and use her walker and do all of her ADLs without difficulty.  However in the last few days patient states she is not gotten out of bed.  Today she slid out of bed to the floor and was unable to get up.  When EMS arrived patient stated she did not want to come to the hospital however they requested that she get out of bed and walk and she was unable to do this.  Patient does admit to being slightly short of breath but denies cough, fever, chest pain, abdominal pain, nausea or vomiting.  She does intermittently get upper back pain which she states comes and goes which she has had for years and cannot further qualify.  She does complain of her legs feeling weak but denies any numbness or pain.  The history is provided by the patient and a relative. History limited by: patient is a vague historian.  Weakness Severity:  Severe Onset quality:  Unable to specify Duration:  2 days (maybe more) Timing:  Constant Progression:  Unchanged Chronicity:  New Relieved by:  None tried Exacerbated by: trying to stand and walk. Ineffective treatments:  None tried Associated symptoms: difficulty walking, falls and shortness of breath   Associated symptoms: no abdominal pain, no anorexia, no chest pain, no cough, no numbness in extremities, no fever, no frequency, no loss of consciousness, no nausea and no vomiting   Risk factors comment:  Hx of stroke and  walks with walker, hTN   Past Medical History:  Diagnosis Date  . Stroke [facility])     Patient Active Problem List   Diagnosis Date Noted  . History of CVA in adulthood 11/14/2018  . Physical deconditioning 11/14/2018  . Acute cystitis with hematuria   . E. coli bacteremia 11/12/2018  . Sepsis secondary to UTI ([facility]) 11/11/2018    History reviewed. No pertinent surgical history.   OB History   No obstetric history on file.      Home Medications    Prior to Admission medications   Medication Sig Start Date End Date Taking? Authorizing Provider  amLODipine (NORVASC) 5 MG tablet Take 5 mg by mouth daily. 09/29/18   [provider]  atorvastatin (LIPITOR) 40 MG tablet Take 40 mg by mouth daily. 09/29/18   [provider]  diclofenac sodium (VOLTAREN) 1 % GEL Apply 2 g topically 4 (four) times daily as needed for pain. 06/08/18   [provider]    Family History No family history on file.  Social History Social History   Tobacco Use  . Smoking status: Never Smoker  . Smokeless tobacco: Never Used  Substance Use Topics  . Alcohol use: Yes  . Drug use: Not on file     Allergies   Contrast media [iodinated diagnostic agents]   Review of Systems Review of Systems  Constitutional:  Negative for fever.  Respiratory: Positive for shortness of breath. Negative for cough.   Cardiovascular: Negative for chest pain.  Gastrointestinal: Negative for abdominal pain, anorexia, nausea and vomiting.  Genitourinary: Negative for frequency.  Musculoskeletal: Positive for falls.  Neurological: Positive for weakness. Negative for loss of consciousness.  All other systems reviewed and are negative.    Physical Exam Updated Vital Signs BP 120/68 (BP Location: Right Arm)   Pulse 90   Temp 99.2 F (37.3 C) (Oral)   Resp 18   Ht 5' (1.524 m)   Wt 72.6 kg   LMP  (LMP Unknown)   SpO2 97%   BMI 31.25 kg/m   Physical Exam Vitals signs and nursing note  reviewed.  Constitutional:      General: She is not in acute distress.    Appearance: She is well-developed and normal weight.  HENT:     Head: Normocephalic and atraumatic.     Mouth/Throat:     Mouth: Mucous membranes are moist.  Eyes:     Conjunctiva/sclera: Conjunctivae normal.     Pupils: Pupils are equal, round, and reactive to light.  Neck:     Musculoskeletal: Normal range of motion and neck supple.  Cardiovascular:     Rate and Rhythm: Normal rate and regular rhythm.     Pulses: Normal pulses.     Heart sounds: No murmur.  Pulmonary:     Effort: Pulmonary effort is normal. No respiratory distress.     Breath sounds: Normal breath sounds. No wheezing or rales.  Abdominal:     General: There is no distension.     Palpations: Abdomen is soft.     Tenderness: There is no abdominal tenderness. There is no guarding or rebound.  Musculoskeletal: Normal range of motion.        General: No tenderness.     Right lower leg: No edema.     Left lower leg: No edema.     Comments: No point tender spinal pain  Skin:    General: Skin is warm and dry.     Findings: No erythema or rash.  Neurological:     Mental Status: She is alert and oriented to person, place, and time.     Comments: Unable to lift the right leg off the bed with 3 out of 5 strength.  4 out of 5 strength in the right lower extremity.  5 out of 5 strength and normal handgrip in bilateral upper extremities.  Psychiatric:        Mood and Affect: Mood normal.        Behavior: Behavior normal.        Thought Content: Thought content normal.      ED Treatments / Results  Labs (all labs ordered are listed, but only abnormal results are displayed) Labs Reviewed  CBC WITH DIFFERENTIAL/PLATELET - Abnormal; Notable for the following components:      Result Value   Hemoglobin 11.6 (*)    All other components within normal limits  COMPREHENSIVE METABOLIC PANEL - Abnormal; Notable for the following components:    Glucose, Bld 102 (*)    Creatinine, Ser 1.22 (*)    Albumin 3.1 (*)    Total Bilirubin 1.4 (*)    GFR calc non Af Amer 43 (*)    GFR calc Af Amer 50 (*)    All other components within normal limits  CK - Abnormal; Notable for the following components:   Total CK 851 (*)  All other components within normal limits  CBG MONITORING, ED - Abnormal; Notable for the following components:   Glucose-Capillary 113 (*)    All other components within normal limits  URINE CULTURE  URINALYSIS, ROUTINE W REFLEX MICROSCOPIC  TROPONIN I (HIGH SENSITIVITY)  TROPONIN I (HIGH SENSITIVITY)    EKG EKG Interpretation  Date/Time:  Friday March 09 2019 14:59:50 EST Ventricular Rate:  94 PR Interval:    QRS Duration: 99 QT Interval:  376 QTC Calculation: 471 R Axis:   -57 Text Interpretation: Sinus rhythm Left anterior fascicular block Probable anterior infarct, age indeterminate No significant change since last tracing Confirmed by Gwyneth SproutPlunkett, Keria Widrig ([id number]) on 03/09/2019 3:08:54 PM   Radiology Ct Head Wo Contrast  Result Date: 03/09/2019 CLINICAL DATA:  Focal neuro deficit, > 6 hrs, stroke suspected patient reports generalized weakness. EXAM: CT HEAD WITHOUT CONTRAST TECHNIQUE: Contiguous axial images were obtained from the base of the skull through the vertex without intravenous contrast. COMPARISON:  Head CT 11/11/2018 FINDINGS: Brain: No intracranial hemorrhage, mass effect, or midline shift. Stable brain volume with asymmetric ventricular size, right greater than left, unchanged. Periventricular and deep white matter hypodensities are nonspecific but most commonly seen with chronic small vessel disease. This is overall unchanged from prior exam. No hydrocephalus. The basilar cisterns are patent. No evidence of territorial infarct or acute ischemia. No extra-axial or intracranial fluid collection. Vascular: Atherosclerosis of skullbase vasculature without hyperdense vessel or abnormal  calcification. Skull: No fracture or focal lesion. Sinuses/Orbits: Paranasal sinuses and mastoid air cells are clear. The visualized orbits are unremarkable. Other: None. IMPRESSION: 1. No acute intracranial abnormality. 2. Unchanged atrophy and chronic small vessel ischemia. Electronically Signed   By: Narda RutherfordMelanie  Sanford M.D.   On: 03/09/2019 16:21   Dg Chest Port 1 View  Result Date: 03/09/2019 CLINICAL DATA:  Weakness EXAM: PORTABLE CHEST 1 VIEW COMPARISON:  11/11/2018 FINDINGS: Artifact from EKG leads. Borderline heart size.  Aortic tortuosity. There is no edema, consolidation, effusion, or pneumothorax. IMPRESSION: No evidence of active disease. Electronically Signed   By: Marnee SpringJonathon  Watts M.D.   On: 03/09/2019 16:04    Procedures Procedures (including critical care time)  Medications Ordered in ED Medications - No data to display   Initial Impression / Assessment and Plan / ED Course  I have reviewed the triage vital signs and the nursing notes.  Pertinent labs & imaging results that were available during my care of the patient were reviewed by me and considered in my medical decision making (see chart for details).        Elderly female presenting today with inability to get out of bed and walk.  Normally patient can perform all of her ADLs and uses a walker per her daughter.  Patient is guarded and does not want to be here she is not providing much history but does admit to not being able to get out of bed for at least the last 2 days.  She states she is eating and taking her medications.  She does live with her grandson which her daughter states make sure that she is safe.  Patient has intermittent upper back pain but denies any current pain.  She denies any chest pain abdominal pain but does admit to having shortness of breath but will not further elaborate.  Patient has noted to have right leg weakness today which daughter states she does not chronically have.  Concern for new stroke  versus infection as she does have a prior history  of E. coli UTI with sepsis.  Patient's vital signs are stable but temperature is 99.2 and pulse rate is between 80 and 100.  EKG without significant findings.  Labs and imaging pending.  7:50 PM Labs are reassuring except for mild AKI with creatinine of 1.22 and CK of 850.  Still waiting on a urine but delta troponin within normal limits, hemoglobin and white count are stable.  Patient was given a liter of fluid given mild AKI and elevated CK.  11:47 PM Still waiting for a urine.  Pt was able to ambulate with walker and steady.  Final Clinical Impressions(s) / ED Diagnoses   Final diagnoses:  None    ED Discharge Orders    None       Blanchie Dessert, MD 03/09/19 [phone]

## 2019-03-09 NOTE — ED Notes (Signed)
Patient transported to CT 

## 2019-03-10 DIAGNOSIS — R531 Weakness: Secondary | ICD-10-CM

## 2019-03-10 DIAGNOSIS — N179 Acute kidney failure, unspecified: Secondary | ICD-10-CM | POA: Diagnosis present

## 2019-03-10 DIAGNOSIS — R748 Abnormal levels of other serum enzymes: Secondary | ICD-10-CM | POA: Diagnosis present

## 2019-03-10 LAB — URINALYSIS, ROUTINE W REFLEX MICROSCOPIC
Bilirubin Urine: NEGATIVE
Glucose, UA: NEGATIVE mg/dL
Ketones, ur: NEGATIVE mg/dL
Nitrite: NEGATIVE
Protein, ur: NEGATIVE mg/dL
Specific Gravity, Urine: 1.018 (ref 1.005–1.030)
pH: 5 (ref 5.0–8.0)

## 2019-03-10 LAB — CBC
HCT: 33.7 % — ABNORMAL LOW (ref 36.0–46.0)
Hemoglobin: 10.8 g/dL — ABNORMAL LOW (ref 12.0–15.0)
MCH: 26.8 pg (ref 26.0–34.0)
MCHC: 32 g/dL (ref 30.0–36.0)
MCV: 83.6 fL (ref 80.0–100.0)
Platelets: 274 10*3/uL (ref 150–400)
RBC: 4.03 MIL/uL (ref 3.87–5.11)
RDW: 13.7 % (ref 11.5–15.5)
WBC: 5.6 10*3/uL (ref 4.0–10.5)
nRBC: 0 % (ref 0.0–0.2)

## 2019-03-10 LAB — CREATININE, SERUM
Creatinine, Ser: 0.83 mg/dL (ref 0.44–1.00)
GFR calc Af Amer: >60 mL/min (ref >60)
GFR calc non Af Amer: >60 mL/min (ref >60)

## 2019-03-10 LAB — SARS CORONAVIRUS 2 (TAT 6-24 HRS): SARS Coronavirus 2: NEGATIVE

## 2019-03-10 MED ORDER — ATORVASTATIN CALCIUM 40 MG PO TABS
40.0000 mg | ORAL_TABLET | Freq: Every day | ORAL | Status: DC
  Administered 2019-03-10 – 2019-03-11 (×2): 40 mg via ORAL
  Filled 2019-03-10 (×2): qty 1

## 2019-03-10 MED ORDER — SODIUM CHLORIDE 0.9 % IV BOLUS
1000.0000 mL | Freq: Once | INTRAVENOUS | Status: AC
  Administered 2019-03-10: 12:00:00 1000 mL via INTRAVENOUS

## 2019-03-10 MED ORDER — SODIUM CHLORIDE 0.9 % IV SOLN
1.0000 g | Freq: Once | INTRAVENOUS | Status: DC
  Administered 2019-03-10: 11:00:00 1 g via INTRAVENOUS
  Filled 2019-03-10: qty 10

## 2019-03-10 MED ORDER — AMLODIPINE BESYLATE 5 MG PO TABS
5.0000 mg | ORAL_TABLET | Freq: Every day | ORAL | Status: DC
  Administered 2019-03-11: 10:00:00 5 mg via ORAL
  Filled 2019-03-10: qty 1

## 2019-03-10 MED ORDER — ENOXAPARIN SODIUM 40 MG/0.4ML ~~LOC~~ SOLN
40.0000 mg | SUBCUTANEOUS | Status: DC
  Administered 2019-03-10: 13:00:00 40 mg via SUBCUTANEOUS
  Filled 2019-03-10 (×2): qty 0.4

## 2019-03-10 MED ORDER — SODIUM CHLORIDE 0.9 % IV SOLN
INTRAVENOUS | Status: AC
  Administered 2019-03-10: 13:00:00 via INTRAVENOUS

## 2019-03-10 NOTE — ED Notes (Addendum)
ED TO INPATIENT HANDOFF REPORT  ED Nurse Name and Phone #: Alycia RossettiRyan, [phone]  S Name/Age/Gender Mariah May 73 y.o. female Room/Bed: 017C/017C  Code Status   Code Status: Full Code  Home/SNF/Other Home Patient oriented to: self, place, time and situation Is this baseline? Yes   Triage Complete: Triage complete  Chief Complaint weakness  Triage Note Per [facility] pt coming from home c/o generalized weakness for a few days. Patient states history of stroke and every once in a while has difficulty ambulating. Uses a walker at home. Denies any pain. Alert and orientated x4.    Allergies Allergies  Allergen Reactions  . Contrast Media [Iodinated Diagnostic Agents]     Level of Care/Admitting Diagnosis ED Disposition    ED Disposition Condition Comment   Admit  [facility] [[id number]]  Level of Care: Med-Surg [16]  Covid Evaluation: N/A  Diagnosis: Generalized weakness [[id number]][[id number]]  Admitting Physician: Tyson AliasVINCENT, DUNCAN THOMAS [[id number]]  Attending Physician: Tyson AliasVINCENT, DUNCAN THOMAS [phone][[id number]]  PT Class (Do Not Modify): Observation [104]  PT Acc Code (Do Not Modify): Observation [10022]       B Medical/Surgery History Past Medical History:  Diagnosis Date  . Stroke [facility](HCC)    History reviewed. No pertinent surgical history.   A IV Location/Drains/Wounds Patient Lines/Drains/Airways Status   Active Line/Drains/Airways    Name:   Placement date:   Placement time:   Site:   Days:   Peripheral IV 03/10/19 Right Arm   03/10/19    1120    Arm   less than 1          Intake/Output Last 24 hours  Intake/Output Summary (Last 24 hours) at 03/10/2019 1209 Last data filed at 03/09/2019 2339 Gross per 24 hour  Intake 1000 ml  Output -  Net 1000 ml    Labs/Imaging Results for orders placed or performed during the hospital encounter of 03/09/19 (from the past 48 hour(s))  CBG monitoring, ED     Status: Abnormal   Collection Time: 03/09/19  3:03  PM  Result Value Ref Range   Glucose-Capillary 113 (H) 70 - 99 mg/dL   Comment 1 Notify RN    Comment 2 Document in Chart   CBC with Differential     Status: Abnormal   Collection Time: 03/09/19  3:43 PM  Result Value Ref Range   WBC 8.0 4.0 - 10.5 K/uL   RBC 4.36 3.87 - 5.11 MIL/uL   Hemoglobin 11.6 (L) 12.0 - 15.0 g/dL   HCT 01.037.1 27.236.0 - 53.646.0 %   MCV 85.1 80.0 - 100.0 fL   MCH 26.6 26.0 - 34.0 pg   MCHC 31.3 30.0 - 36.0 g/dL   RDW 64.414.1 03.411.5 - 74.215.5 %   Platelets 319 150 - 400 K/uL   nRBC 0.0 0.0 - 0.2 %   Neutrophils Relative % 69 %   Neutro Abs 5.5 1.7 - 7.7 K/uL   Lymphocytes Relative 21 %   Lymphs Abs 1.6 0.7 - 4.0 K/uL   Monocytes Relative 8 %   Monocytes Absolute 0.6 0.1 - 1.0 K/uL   Eosinophils Relative 1 %   Eosinophils Absolute 0.1 0.0 - 0.5 K/uL   Basophils Relative 1 %   Basophils Absolute 0.1 0.0 - 0.1 K/uL   Immature Granulocytes 0 %   Abs Immature Granulocytes 0.03 0.00 - 0.07 K/uL    Comment: Performed at [facility], [address] [place], [place] [id number]  Comprehensive metabolic panel  Status: Abnormal   Collection Time: 03/09/19  3:43 PM  Result Value Ref Range   Sodium 140 135 - 145 mmol/L   Potassium 4.1 3.5 - 5.1 mmol/L    Comment: SLIGHT HEMOLYSIS   Chloride 106 98 - 111 mmol/L   CO2 22 22 - 32 mmol/L   Glucose, Bld 102 (H) 70 - 99 mg/dL   BUN 12 8 - 23 mg/dL   Creatinine, Ser 4.48 (H) 0.44 - 1.00 mg/dL   Calcium 8.9 8.9 - 18.5 mg/dL   Total Protein 7.7 6.5 - 8.1 g/dL   Albumin 3.1 (L) 3.5 - 5.0 g/dL   AST 35 15 - 41 U/L   ALT 16 0 - 44 U/L   Alkaline Phosphatase 107 38 - 126 U/L   Total Bilirubin 1.4 (H) 0.3 - 1.2 mg/dL   GFR calc non Af Amer 43 (L) >60 mL/min   GFR calc Af Amer 50 (L) >60 mL/min   Anion gap 12 5 - 15    Comment: Performed at [facility], [address], [place], [place] [address]  CK     Status: Abnormal   Collection Time: 03/09/19  3:43 PM  Result Value Ref Range   Total CK 851 (H) 38 - 234 U/L     Comment: SLIGHT HEMOLYSIS Performed at [facility], [address], [place], [place] [address]   Troponin I (High Sensitivity)     Status: None   Collection Time: 03/09/19  3:45 PM  Result Value Ref Range   Troponin I (High Sensitivity) 10 <18 ng/L    Comment: (NOTE) Elevated high sensitivity troponin I (hsTnI) values and significant  changes across serial measurements may suggest ACS but many other  chronic and acute conditions are known to elevate hsTnI results.  Refer to the "Links" section for chest pain algorithms and additional  guidance. Performed at [facility], [address] [place], [place] [address]   Troponin I (High Sensitivity)     Status: None   Collection Time: 03/09/19  5:56 PM  Result Value Ref Range   Troponin I (High Sensitivity) 8 <18 ng/L    Comment: (NOTE) Elevated high sensitivity troponin I (hsTnI) values and significant  changes across serial measurements may suggest ACS but many other  chronic and acute conditions are known to elevate hsTnI results.  Refer to the "Links" section for chest pain algorithms and additional  guidance. Performed at [facility], [address], [place], [place] [address]   Urinalysis, Routine w reflex microscopic     Status: Abnormal   Collection Time: 03/10/19  6:55 AM  Result Value Ref Range   Color, Urine YELLOW YELLOW   APPearance HAZY (A) CLEAR   Specific Gravity, Urine 1.018 1.005 - 1.030   pH 5.0 5.0 - 8.0   Glucose, UA NEGATIVE NEGATIVE mg/dL   Hgb urine dipstick MODERATE (A) NEGATIVE   Bilirubin Urine NEGATIVE NEGATIVE   Ketones, ur NEGATIVE NEGATIVE mg/dL   Protein, ur NEGATIVE NEGATIVE mg/dL   Nitrite NEGATIVE NEGATIVE   Leukocytes,Ua MODERATE (A) NEGATIVE   RBC / HPF 0-5 0 - 5 RBC/hpf   WBC, UA 11-20 0 - 5 WBC/hpf   Bacteria, UA MANY (A) NONE SEEN   Squamous Epithelial / LPF 0-5 0 - 5    Comment: Performed at [facility], [address], [place], [place] [address]   Ct Head  Wo Contrast  Result Date: 03/09/2019 CLINICAL DATA:  Focal neuro deficit, > 6 hrs, stroke suspected patient reports generalized weakness. EXAM: CT HEAD WITHOUT CONTRAST TECHNIQUE: Contiguous axial images were obtained from the base of the skull through the vertex without intravenous contrast. COMPARISON:  Head CT 11/11/2018 FINDINGS: Brain: No intracranial hemorrhage, mass effect, or midline shift. Stable brain volume with asymmetric ventricular size, right greater than left, unchanged. Periventricular and deep white matter hypodensities are nonspecific but most commonly seen with chronic small vessel disease. This is overall unchanged from prior exam. No hydrocephalus. The basilar cisterns are patent. No evidence of territorial infarct or acute ischemia. No extra-axial or intracranial fluid collection. Vascular: Atherosclerosis of skullbase vasculature without hyperdense vessel or abnormal calcification. Skull: No fracture or focal lesion. Sinuses/Orbits: Paranasal sinuses and mastoid air cells are clear. The visualized orbits are unremarkable. Other: None. IMPRESSION: 1. No acute intracranial abnormality. 2. Unchanged atrophy and chronic small vessel ischemia. Electronically Signed   By: Keith Rake M.D.   On: 03/09/2019 16:21   Dg Chest Port 1 View  Result Date: 03/09/2019 CLINICAL DATA:  Weakness EXAM: PORTABLE CHEST 1 VIEW COMPARISON:  11/11/2018 FINDINGS: Artifact from EKG leads. Borderline heart size.  Aortic tortuosity. There is no edema, consolidation, effusion, or pneumothorax. IMPRESSION: No evidence of active disease. Electronically Signed   By: Monte Fantasia M.D.   On: 03/09/2019 16:04    Pending Labs Unresulted Labs (From admission, onward)    Start     Ordered   03/17/19 0500  Creatinine, serum  (enoxaparin (LOVENOX)    CrCl < 30 ml/min)  Weekly,   R    Comments: while on enoxaparin therapy.    03/10/19 1121   03/11/19 0355  Basic metabolic panel  Tomorrow morning,   R      03/10/19 1121   03/11/19 0500  CBC  Tomorrow morning,   R     03/10/19 1121   03/11/19 0500  CK  Tomorrow morning,   R     03/10/19 1129   03/10/19 1120  CBC  (enoxaparin (LOVENOX)    CrCl < 30 ml/min)  Once,   STAT    Comments: Baseline for enoxaparin therapy IF NOT ALREADY DRAWN.  Notify MD if PLT < 100 K.    03/10/19 1121   03/10/19 1120  Creatinine, serum  (enoxaparin (LOVENOX)    CrCl < 30 ml/min)  Once,   STAT    Comments: Baseline for enoxaparin therapy IF NOT ALREADY DRAWN.    03/10/19 1121   03/10/19 0831  SARS CORONAVIRUS 2 (TAT 6-24 HRS) Nasopharyngeal Nasopharyngeal Swab  (Asymptomatic/Tier 3)  Once,   STAT    Question Answer Comment  Is this test for diagnosis or screening Screening   Symptomatic for COVID-19 as defined by CDC No   Hospitalized for COVID-19 No   Admitted to ICU for COVID-19 No   Previously tested for COVID-19 Yes   Resident in a congregate (group) care setting No   Employed in healthcare setting No   Pregnant No      03/10/19 0830   03/09/19 1528  Urine culture  Once,   STAT     03/09/19 1527          Vitals/Pain Today's Vitals   03/10/19 0045 03/10/19 0249 03/10/19 0645 03/10/19 0934  BP: 118/77 123/76 120/61 128/71  Pulse: 62 64 60 72  Resp: 14 15 16 18   Temp:      TempSrc:      SpO2: 98% 98% 98% 100%  Weight:  Height:      PainSc:        Isolation Precautions No active isolations  Medications Medications  0.9 %  sodium chloride infusion (has no administration in time range)  enoxaparin (LOVENOX) injection 40 mg (has no administration in time range)  sodium chloride 0.9 % bolus 1,000 mL (0 mLs Intravenous Stopped 03/09/19 2339)  sodium chloride 0.9 % bolus 1,000 mL (1,000 mLs Intravenous New Bag/Given 03/10/19 1130)    Mobility walks with device Low fall risk   Focused Assessments    R Recommendations: See Admitting Provider Note  Report given to: 6N RN  Additional Notes:

## 2019-03-10 NOTE — ED Provider Notes (Signed)
Patient signed out pending urinalysis.  In brief, noted to be more generally weak over the last several days.  Slid out of her bed this morning and was unable to get up.  Per family at baseline she walks with a walker and is able to get around the house on her own.  Noted no focal deficits noted by prior attending.  Lab work-up is largely reassuring.  She has not been able to urinate and refuses in and out cath.  She was able to ambulate at her baseline with a walker with no difficulty.  Disposition pending urinalysis.  Multiple attempts overnight to obtain urine from the patient.  Patient has not been able to urinate.  I had a long conversation with the paper.  She is very afraid to be in and out cath.  I discussed with her that it was imperative to obtain a urine sample to treat her.  If she cannot urinate on her own, we need to catheterize her for urine.  She finally consented at 6:30 AM.  I have requested nursing immediately in and out catheter so she can have a disposition.   Merryl Hacker, MD 03/10/19 [phone]

## 2019-03-10 NOTE — H&P (Signed)
Date: 03/10/2019               Patient Name:  Mariah May MRN: [id number]  DOB: [birth year] Age / Sex: 73 y.o., female   PCP: Patient, No Pcp Per         Medical Service: Internal Medicine Teaching Service         Attending Physician: Dr. Evette Doffing, Mallie Mussel, *    First Contact: Marva Panda, MD, [place] Pager: [place] [phone])  Second Contact: Eileen Stanford, MD, [place] Pager: OA [phone])       After Hours (After 5p/  First Contact Pager: [phone]  weekends / holidays): Second Contact Pager: [phone]   Chief Complaint: generalized weakness   History of Present Illness: Mariah May is a  73 y.o. female w/ PMH significant for hypertension,  Hyperlipidemia, CVA and osteoarthritis presenting with two days of generalized weakness and inability to get out of bed. History obtained by patient and chart review. Patient moved from [place] to [place] in February. She notes that she is living by herself and is normally independent in her ADLs without any difficulty. She uses a walker to assist with ambulation. However, she notes that she has not been able to do so over the past few days and has had decreased PO intake as a result of not being able to cook for herself due to increasing weakness. Yesterday, patient slid out of bed to the floor and was unable to get up for which EMS was called. She notes leg weakness but denies any numbness or pain. She denies any fevers, chills, nausea/vomiting, abdominal pain, diarrhea, chest pain or shortness of breath.  Of note, patient was recently hospitalized in August 2020 for E.coli bacteremia 2/2 UTI  and AKI. She was fluid resuscitated, stabilized and discharged with 10 day course of Bactrim. It is unclear if patient completed this course at home. She was also discharged home with home health services but unsure if patient is receiving this at home.   ED course: Patient found to have mild AKI with sCr of 1.22. Given CK of 850, concerns for early  rhabdomyolysis. Patient given a 1L NS for her AKI and elevated CK.  Her vitals were stable and no leukocytosis noted on labs. EKG without significant findings. Delta troponin wnl. Also concerns for UTI given her bacteruria and leukocytes on UA and was given one dose of Rocephin. Patient admitted for further evaluation and management.   Meds:  Current Meds  Medication Sig  . acetaminophen (TYLENOL) 325 MG tablet Take 650 mg by mouth every 6 (six) hours as needed for mild pain.  Marland Kitchen amLODipine (NORVASC) 5 MG tablet Take 5 mg by mouth daily.  Marland Kitchen atorvastatin (LIPITOR) 40 MG tablet Take 40 mg by mouth daily.    Allergies: Allergies as of 03/09/2019 - Review Complete 03/09/2019  Allergen Reaction Noted  . Contrast media [iodinated diagnostic agents]  11/11/2018   Past Medical History:  Diagnosis Date  . Stroke [facility])    Family History: No family history on file.  Unknown family history.   Social History:  Social History   Tobacco Use  . Smoking status: Never Smoker  . Smokeless tobacco: Never Used  Substance Use Topics  . Alcohol use: Yes  . Drug use: Not on file   Patient moved from [place] to [place] in February. She notes that she used to have meals on wheels deliver food for her in [place] but does not have that here. Patient lives by  herself at home and uses walker to help with ambulation. She is independent in her ADL's. She has two children that live in the area. She denies any smoking history. She has occasional alcohol use and no illicit drug use.   Review of Systems: A complete ROS was negative except as per HPI.   Physical Exam: Blood pressure 128/71, pulse 72, temperature 99.2 F (37.3 C), temperature source Oral, resp. rate 18, height 5' (1.524 m), weight 72.6 kg, SpO2 100 %. Physical Exam Vitals signs and nursing note reviewed.  Constitutional:      General: She is not in acute distress.    Appearance: Normal appearance. She is not diaphoretic.  HENT:      Head: Normocephalic and atraumatic.     Mouth/Throat:     Mouth: Mucous membranes are moist.     Pharynx: Oropharynx is clear. No oropharyngeal exudate or posterior oropharyngeal erythema.  Eyes:     General: No scleral icterus.    Extraocular Movements: Extraocular movements intact.     Conjunctiva/sclera: Conjunctivae normal.     Pupils: Pupils are equal, round, and reactive to light.  Neck:     Musculoskeletal: Normal range of motion and neck supple.  Cardiovascular:     Rate and Rhythm: Normal rate and regular rhythm.     Pulses: Normal pulses.     Heart sounds: Normal heart sounds. No murmur. No friction rub. No gallop.   Pulmonary:     Effort: Pulmonary effort is normal. No respiratory distress.     Breath sounds: Normal breath sounds. No wheezing, rhonchi or rales.  Abdominal:     General: Bowel sounds are normal. There is no distension.     Palpations: Abdomen is soft. There is no mass.     Tenderness: There is no abdominal tenderness. There is no guarding or rebound.  Musculoskeletal: Normal range of motion.  Skin:    General: Skin is warm and dry.     Capillary Refill: Capillary refill takes less than 2 seconds.  Neurological:     Mental Status: She is alert and oriented to person, place, and time. Mental status is at baseline.     Cranial Nerves: No cranial nerve deficit.     Sensory: No sensory deficit.     Comments: Weakness in bilateral lower extremities: strength 3/5 in RLE and 4/5 in LLE Strength 5/5 in bilateral upper extremities   Psychiatric:        Mood and Affect: Mood normal.        Thought Content: Thought content normal.        Judgment: Judgment normal.     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 No acute intracranial abnormality. 2.  Unchanged atrophy and chronic small vessel ischemia.  Assessment & Plan by Problem:  Mariah May is a 73yo female with history of HTN, HLD, CVA, OA presenting with two days of generalized weakness in the setting of decreased PO intake noted to have AKI and elevated CK concerning for early rhabdomyolysis.   Generalized weakness:  Early signs of rhabdomyolysis: Patient presents with generalized weakness in setting of decreased PO intake for two days duration and has not been able to get out of bed. No fevers, chills or acute pain noted. She is afebrile and VSS. She does not have any leukocytosis  on labs. CK elevated to 851 with moderate amounts of hemoglobinuria concerning for rhabdomyolysis.  - Continuous NS 75 cc/hr - F/u CK in AM - PT/OT evaluation  AKI:  sCr 0.84 upon discharge in August. Today, patient presents with BUN/Cr of 12/1.22 with GFR 50. Likely pre-renal in setting of decreased PO intake for at least two days duration. She has received 2L of NS in the ED.  - BMP in AM - Avoid nephrotoxic agents  Bacteruria:  Patient with history of E.coli bacteremia 2/2 UTI in August. She was discharged home with Bactrim for 10 days duration. Unclear if patient completed her course of antibiotics. Patient has urinary incontinence at baseline and reports that she wears a diaper. She denies any dysuria or increased urinary frequency. On examination, no suprapubic tenderness noted. UA on this admission with moderate leukocytes and bacteria noted with 11-20 WBC. However, no nitrites. Patient received one dose of rocephin in the ED and urine culture sent. She is afebrile and no leukocytosis on labs. Will hold off on antibiotics for now.  - Continue to monitor - f/u urine culture  History of CVA: Patient with history of CVA and deconditioning and uses walker for ambulation. She has decreased motor strength in bilateral lower extremities (R>L), unclear if this is baseline.  - Atorvastatin 40mg  qd -  Amlodipine 5mg  qd - PT/OT evaluation  FEN: Na restricted diet, NS 75 cc/hr Code: FULL DVT Prophylaxis: Lovenox   Dispo: Admit patient to Observation with expected length of stay less than 2 midnights.  Signed: , MD  Internal Medicine, PGY-1 03/10/2019, 11:58 AM  Pager: [phone]

## 2019-03-10 NOTE — Evaluation (Signed)
Occupational Therapy Evaluation Patient Details Name: Mariah MiuHelen May MRN: [id number] DOB: 1945/07/19 Today's Date: 03/10/2019    History of Present Illness Pt is a 73 y/o femalr with PMH of HTN, CVA and OA presenting with 2 days of generalized weakness and inability to get out of bed. Found with AKI and elevated CK concerning for early rhabdomyolysis.    Clinical Impression   PTA patient reports independent with ADLs, IADLs using rollator. Admitted for above and limited by problem list below, including impaired balance, generalized weakness, and decreased activity tolerance.  Requires mod assist for bed mobility, min- mod assist for basic transfers using RW, mod assist for LB ADls and min to setup for UB ADls.  She is oriented, follows 1 step commands with increased time and presents with poor awareness of safety/deficts.  Patient reports she will have 24/7 support at home from grandson, but poor historian throughout session.  Patient will benefit from continued OT services while admitted [facility] has [facility], otherwise recommend SNF rehab to maximize independence and safety prior to dc home.     Follow Up Recommendations  Home health OT;Supervision/Assistance - 24 hour(if doesn't have 24/7 support, will require SNF )    Equipment Recommendations  None recommended by OT    Recommendations for Other Services       Precautions / Restrictions Precautions Precautions: Fall Restrictions Weight Bearing Restrictions: No      Mobility Bed Mobility Overal bed mobility: Needs Assistance Bed Mobility: Supine to Sit     Supine to sit: Mod assist     General bed mobility comments: patient able to manage LEs to EOB, but requires mod assist for trunk support and scooting to EOB  Transfers Overall transfer level: Needs assistance Equipment used: Rolling walker (2 wheeled) Transfers: Sit to/from UGI CorporationStand;Stand Pivot Transfers Sit to Stand: Mod assist Stand pivot  transfers: Min assist       General transfer comment: mod assist to power up and steady with cueing for hand placement and safety; min assist to pivot to recliner     Balance Overall balance assessment: Needs assistance Sitting-balance support: No upper extremity supported;Feet supported Sitting balance-Leahy Scale: Fair     Standing balance support: Bilateral upper extremity supported;During functional activity Standing balance-Leahy Scale: Poor Standing balance comment: relaint on BUE and external support                           ADL either performed or assessed with clinical judgement   ADL Overall ADL's : Needs assistance/impaired Eating/Feeding: Set up;Sitting   Grooming: Set up;Sitting   Upper Body Bathing: Set up;Sitting   Lower Body Bathing: Moderate assistance;Sit to/from stand   Upper Body Dressing : Minimal assistance;Sitting   Lower Body Dressing: Moderate assistance;Sit to/from stand Lower Body Dressing Details (indicate cue type and reason): assist for sock, mod assist sit to stand  Toilet Transfer: Moderate assistance;Stand-pivot;RW Toilet Transfer Details (indicate cue type and reason): simulated to reclienr  Toileting- ArchitectClothing Manipulation and Hygiene: Total assistance;+2 for physical assistance;Sit to/from stand Toileting - Clothing Manipulation Details (indicate cue type and reason): therapist assist with standing with tech assist with hygiene after incontinent urine     Functional mobility during ADLs: Moderate assistance;Rolling walker;Cueing for safety;Cueing for sequencing General ADL Comments: pt limited by generalized weakness, decreased activity tolerance, impaired balance     Vision   Vision Assessment?: No apparent visual deficits  Perception     Praxis      Pertinent Vitals/Pain Pain Assessment: No/denies pain     Hand Dominance Right   Extremity/Trunk Assessment Upper Extremity Assessment Upper Extremity  Assessment: Generalized weakness   Lower Extremity Assessment Lower Extremity Assessment: Defer to PT evaluation       Communication Communication Communication: HOH   Cognition Arousal/Alertness: Awake/alert Behavior During Therapy: WFL for tasks assessed/performed Overall Cognitive Status: No family/caregiver present to determine baseline cognitive functioning Area of Impairment: Following commands;Problem solving;Awareness                       Following Commands: Follows one step commands consistently;Follows one step commands with increased time;Follows multi-step commands inconsistently   Awareness: Emergent Problem Solving: Slow processing;Decreased initiation;Difficulty sequencing;Requires verbal cues General Comments: pt HOH, able to follow 1 step commands with increased time; oriented and aware of incontient of urine    General Comments       Exercises     Shoulder Instructions      Home Living Family/patient expects to be discharged to:: Private residence Living Arrangements: Alone Available Help at Discharge: Family;Available 24 hours/day(reports grandson) Type of Home: Mobile home Home Access: Stairs to enter Entrance Stairs-Number of Steps: 5 Entrance Stairs-Rails: Can reach both Home Layout: One level     Bathroom Shower/Tub: Occupational psychologist: Standard     Home Equipment: Bedside commode;Walker - 4 wheels   Additional Comments: pt poor historian       Prior Functioning/Environment Level of Independence: Independent with assistive device(s)        Comments: poor historian, reports independent with ADLs, IADLs using rollator         OT Problem List: Decreased strength;Decreased activity tolerance;Impaired balance (sitting and/or standing);Obesity;Decreased knowledge of precautions;Decreased knowledge of use of DME or AE;Decreased safety awareness      OT Treatment/Interventions: DME and/or AE instruction;Therapeutic  activities;Balance training;Patient/family education;Therapeutic exercise;Self-care/ADL training    OT Goals(Current goals can be found in the care plan section) Acute Rehab OT Goals Patient Stated Goal: to go home tomorrow OT Goal Formulation: With patient Time For Goal Achievement: 03/24/19 Potential to Achieve Goals: Good  OT Frequency: Min 2X/week   Barriers to D/C:            Co-evaluation              AM-PAC OT "6 Clicks" Daily Activity     Outcome Measure Help from another person eating meals?: None Help from another person taking care of personal grooming?: A Little Help from another person toileting, which includes using toliet, bedpan, or urinal?: Total Help from another person bathing (including washing, rinsing, drying)?: A Lot Help from another person to put on and taking off regular upper body clothing?: A Little Help from another person to put on and taking off regular lower body clothing?: A Lot 6 Click Score: 15   End of Session Equipment Utilized During Treatment: Rolling walker Nurse Communication: Mobility status  Activity Tolerance: Patient tolerated treatment well Patient left: in chair;with call bell/phone within reach;with chair alarm set  OT Visit Diagnosis: Other abnormalities of gait and mobility (R26.89);Muscle weakness (generalized) (M62.81)                Time: 2992-4268 OT Time Calculation (min): 29 min Charges:  OT General Charges $OT Visit: 1 Visit OT Evaluation $OT Eval Moderate Complexity: 1 Mod OT Treatments $Self Care/Home Management : 8-22 mins  Anner Crete  Lawerance Bach, OT Acute Rehabilitation Services Pager [phone] Office [phone]   Chancy Milroy 03/10/2019, 3:57 PM

## 2019-03-10 NOTE — Evaluation (Signed)
Physical Therapy Evaluation Patient Details Name: Mariah May MRN: [id number] DOB: 1945/07/17 Today's Date: 03/10/2019   History of Present Illness  Pt is a 73 y/o femalr with PMH of HTN, CVA and OA presenting with 2 days of generalized weakness and inability to get out of bed. Found with AKI and elevated CK concerning for early rhabdomyolysis.   Clinical Impression  Pt admitted with/for generalized weakness.  Pt needing moderate assist for most mobility on evaluation..  Pt currently limited functionally due to the problems listed below.  (see problems list.)  Pt will benefit from PT to maximize function and safety to be able to get home safely with available assist.     Follow Up Recommendations Home health PT;Supervision/Assistance - 24 hour;Other (comment)(if no 24/7 assist, needs SNF)    Equipment Recommendations  Other (comment);None recommended by PT(TBA)    Recommendations for Other Services       Precautions / Restrictions Precautions Precautions: Fall Restrictions Weight Bearing Restrictions: No      Mobility  Bed Mobility Overal bed mobility: Needs Assistance Bed Mobility: Sit to Supine     Supine to sit: Mod assist     General bed mobility comments: pt needed help with both LE's to get them back into bed.  Pt repositioned herself without assist.  Able to bridge well.  Transfers Overall transfer level: Needs assistance Equipment used: Rolling walker (2 wheeled) Transfers: Sit to/from [employer] Sit to Stand: Mod assist Stand pivot transfers: Min assist       General transfer comment: mod assist to power up and steady with cueing for hand placement and safety; min assist to pivot over 6-8 steps from recliner to bed.  Ambulation/Gait             General Gait Details: assisted pivotal steps with the RW  Stairs            Wheelchair Mobility    Modified Rankin (Stroke Patients Only)       Balance Overall balance  assessment: Needs assistance Sitting-balance support: No upper extremity supported;Feet supported Sitting balance-Leahy Scale: Fair     Standing balance support: Bilateral upper extremity supported;During functional activity Standing balance-Leahy Scale: Poor Standing balance comment: relaint on BUE and external support                             Pertinent Vitals/Pain Pain Assessment: No/denies pain    Home Living Family/patient expects to be discharged to:: Private residence Living Arrangements: Alone Available Help at Discharge: Family;Available 24 hours/day(reports grandson) Type of Home: Mobile home Home Access: Stairs to enter Entrance Stairs-Rails: Can reach both Entrance Stairs-Number of Steps: 5 Home Layout: One level Home Equipment: Bedside commode;Walker - 4 wheels Additional Comments: pt poor historian     Prior Function Level of Independence: Independent with assistive device(s)         Comments: poor historian, reports independent with ADLs, IADLs using rollator      Hand Dominance   Dominant Hand: Right    Extremity/Trunk Assessment   Upper Extremity Assessment Upper Extremity Assessment: Generalized weakness    Lower Extremity Assessment Lower Extremity Assessment: Generalized weakness;RLE deficits/detail RLE Deficits / Details: generally weak at 3 to 3+/5 from stroke.       Communication   Communication: HOH  Cognition Arousal/Alertness: Awake/alert Behavior During Therapy: WFL for tasks assessed/performed Overall Cognitive Status: No family/caregiver present to determine baseline cognitive functioning Area of Impairment: Following  commands;Problem solving;Awareness                       Following Commands: Follows one step commands consistently;Follows one step commands with increased time;Follows multi-step commands inconsistently   Awareness: Emergent Problem Solving: Slow processing;Decreased initiation;Difficulty  sequencing;Requires verbal cues General Comments: pt HOH, able to follow 1 step commands with increased time; oriented and aware of incontient of urine       General Comments      Exercises     Assessment/Plan    PT Assessment Patient needs continued PT services  PT Problem List Decreased strength;Decreased activity tolerance;Decreased balance;Decreased mobility;Pain;Decreased knowledge of use of DME       PT Treatment Interventions Gait training;DME instruction;Stair training;Functional mobility training;Balance training;Therapeutic activities;Patient/family education    PT Goals (Current goals can be found in the Care Plan section)  Acute Rehab PT Goals Patient Stated Goal: to go home tomorrow PT Goal Formulation: With patient Time For Goal Achievement: 03/24/19 Potential to Achieve Goals: Fair    Frequency Min 3X/week   Barriers to discharge        Co-evaluation               AM-PAC PT "6 Clicks" Mobility  Outcome Measure Help needed turning from your back to your side while in a flat bed without using bedrails?: A Lot Help needed moving from lying on your back to sitting on the side of a flat bed without using bedrails?: A Lot Help needed moving to and from a bed to a chair (including a wheelchair)?: A Lot Help needed standing up from a chair using your arms (e.g., wheelchair or bedside chair)?: A Lot Help needed to walk in hospital room?: A Lot Help needed climbing 3-5 steps with a railing? : A Lot 6 Click Score: 12    End of Session     Patient left: in bed;with call bell/phone within reach;with nursing/sitter in room Nurse Communication: Mobility status PT Visit Diagnosis: Unsteadiness on feet (R26.81);Muscle weakness (generalized) (M62.81);Difficulty in walking, not elsewhere classified (R26.2);Pain Pain - part of body: Leg(bil)    Time: 3846-6599 PT Time Calculation (min) (ACUTE ONLY): 20 min   Charges:   PT Evaluation $PT Eval Moderate  Complexity: 1 Mod          03/10/2019  Coleman Bing, PT Acute Rehabilitation Services [phone]  (pager) [phone]  (office)  Mariah May 03/10/2019, 4:54 PM

## 2019-03-10 NOTE — ED Provider Notes (Addendum)
Patient awake and alert, now aware of remaining lab results, which were delayed as the patient had difficulty providing urinalysis. Urinalysis somewhat equivocal, but given the patient's description of weakness, concerning findings, she will receive antibiotics. However, given the patient's elevated CK, acute kidney injury, weakness, she will require additional fluid resuscitation, admission for further monitoring, management.  11:19 AM Patient has no IV access, requires ultrasound-guided right deep brachial.  This was accomplished without complication.  Angiocath insertion Performed by: Carmin Muskrat  Consent: Verbal consent obtained. Risks and benefits: risks, benefits and alternatives were discussed Time out: Immediately prior to procedure a "time out" was called to verify the correct patient, procedure, equipment, support staff and site/side marked as required.  Preparation: Patient was prepped and draped in the usual sterile fashion.  Vein Location: R deep brachial  Ultrasound Guided  Gauge: 20  Normal blood return and flush without difficulty Patient tolerance: Patient tolerated the procedure well with no immediate complications.      Carmin Muskrat, MD 03/10/19 [id number]    Carmin Muskrat, MD 03/10/19 [phone]

## 2019-03-10 NOTE — ED Notes (Addendum)
Pt advises she is still unable to provide urine sample. She also adamantly refused the in and out cath earlier. Oral hydration has been offered, as has ambulating the patient to bedside commode x2 with no success.

## 2019-03-11 DIAGNOSIS — M6282 Rhabdomyolysis: Secondary | ICD-10-CM

## 2019-03-11 DIAGNOSIS — R531 Weakness: Secondary | ICD-10-CM

## 2019-03-11 DIAGNOSIS — R32 Unspecified urinary incontinence: Secondary | ICD-10-CM

## 2019-03-11 DIAGNOSIS — B9620 Unspecified Escherichia coli [E. coli] as the cause of diseases classified elsewhere: Secondary | ICD-10-CM

## 2019-03-11 DIAGNOSIS — N179 Acute kidney failure, unspecified: Principal | ICD-10-CM

## 2019-03-11 DIAGNOSIS — Z91041 Radiographic dye allergy status: Secondary | ICD-10-CM

## 2019-03-11 DIAGNOSIS — N390 Urinary tract infection, site not specified: Secondary | ICD-10-CM

## 2019-03-11 DIAGNOSIS — I69341 Monoplegia of lower limb following cerebral infarction affecting right dominant side: Secondary | ICD-10-CM

## 2019-03-11 DIAGNOSIS — Z87440 Personal history of urinary (tract) infections: Secondary | ICD-10-CM

## 2019-03-11 DIAGNOSIS — E860 Dehydration: Secondary | ICD-10-CM

## 2019-03-11 DIAGNOSIS — I10 Essential (primary) hypertension: Secondary | ICD-10-CM

## 2019-03-11 DIAGNOSIS — E785 Hyperlipidemia, unspecified: Secondary | ICD-10-CM

## 2019-03-11 DIAGNOSIS — M1990 Unspecified osteoarthritis, unspecified site: Secondary | ICD-10-CM

## 2019-03-11 DIAGNOSIS — Z79899 Other long term (current) drug therapy: Secondary | ICD-10-CM

## 2019-03-11 LAB — CBC
HCT: 36.8 % (ref 36.0–46.0)
Hemoglobin: 11.4 g/dL — ABNORMAL LOW (ref 12.0–15.0)
MCH: 26.3 pg (ref 26.0–34.0)
MCHC: 31 g/dL (ref 30.0–36.0)
MCV: 85 fL (ref 80.0–100.0)
Platelets: 304 10*3/uL (ref 150–400)
RBC: 4.33 MIL/uL (ref 3.87–5.11)
RDW: 13.6 % (ref 11.5–15.5)
WBC: 5.4 10*3/uL (ref 4.0–10.5)
nRBC: 0 % (ref 0.0–0.2)

## 2019-03-11 LAB — BASIC METABOLIC PANEL
Anion gap: 11 (ref 5–15)
BUN: 9 mg/dL (ref 8–23)
CO2: 20 mmol/L — ABNORMAL LOW (ref 22–32)
Calcium: 8.5 mg/dL — ABNORMAL LOW (ref 8.9–10.3)
Chloride: 109 mmol/L (ref 98–111)
Creatinine, Ser: 0.83 mg/dL (ref 0.44–1.00)
GFR calc Af Amer: >60 mL/min (ref >60)
GFR calc non Af Amer: >60 mL/min (ref >60)
Glucose, Bld: 111 mg/dL — ABNORMAL HIGH (ref 70–99)
Potassium: 4 mmol/L (ref 3.5–5.1)
Sodium: 140 mmol/L (ref 135–145)

## 2019-03-11 LAB — CK: Total CK: 324 U/L — ABNORMAL HIGH (ref 38–234)

## 2019-03-11 MED ORDER — CEPHALEXIN 500 MG PO CAPS: 500.0000 mg | ORAL_CAPSULE | Freq: Four times a day (QID) | ORAL | 0 refills | Status: AC

## 2019-03-11 MED ORDER — CEPHALEXIN 500 MG PO CAPS
500.0000 mg | ORAL_CAPSULE | Freq: Four times a day (QID) | ORAL | Status: DC
  Administered 2019-03-11: 13:00:00 500 mg via ORAL
  Filled 2019-03-11: qty 1

## 2019-03-11 NOTE — Progress Notes (Signed)
Pt for discharge going home using taxi, trying to reach [facility] her son but unable to reach, but I spoke to her daughter for update, pt alert and responsive, skin intact, no complain of pain at this time, given all her personal belongings, assisted by Nurse tech.

## 2019-03-11 NOTE — Plan of Care (Signed)
Pt admitted for generalized weakness.

## 2019-03-11 NOTE — TOC Transition Note (Signed)
Transition of Care [facility]) - CM/SW Discharge Note   Patient Details  Name: Mariah May MRN: [id number] Date of Birth: 06/01/1945  Transition of Care [facility]) CM/SW Contact:  Claudie Leach, RN Phone Number: [phone] 03/11/2019, 1:57 PM   Clinical Narrative:    Pt to d/c home.  Patient lives alone with son to assist at times.  Patient very [place] but A&O.  Insurance not listed in [place] but patient has valid [facility] Medicare card with prescription coverage.  Patient agreeable to [facility] and has no preference of agency.  Referral accepted by [facility].     Final next level of care: Home w Home Health Services Barriers to Discharge: Family Issues   Patient Goals and CMS Choice Patient states their goals for this hospitalization and ongoing recovery are:: to go home CMS Medicare.gov Compare Post Acute Care list provided to:: Patient Choice offered to / list presented to : Patient   Discharge Plan and Services      HH Arranged: PT, OT, Nurse's Aide [facility] Agency: [place] Date [facility] Agency Contacted: 03/11/19 Time [place]: 3546 Representative spoke with at [place]: Adela Lank    Readmission Risk Interventions Readmission Risk Prevention Plan 11/14/2018  Post Dischage Appt Complete  Medication Screening Complete  Transportation Screening Complete

## 2019-03-11 NOTE — Plan of Care (Signed)
Pt for discharge today going home, alert and oriented, room air, skin intact, discontinued peripheral IV line, given health teachings, next appointment, due med explained and understood, I left message to his son for update, waiting for the family to pick her up.

## 2019-03-11 NOTE — Progress Notes (Addendum)
Subjective: HD#1 Overnight, no acute events reported. This morning, Mariah May was evaluated at bedside. She is resting comfortably in bed. She does not have any acute concerns at this time.   Objective:  Vital signs in last 24 hours: Vitals:   03/10/19 0934 03/10/19 1255 03/10/19 2023 03/11/19 0554  BP: 128/71 118/78 124/62 (!) 114/50  Pulse: 72 70 66 71  Resp: 18 20 20 18   Temp:  98.3 F (36.8 C) 99.1 F (37.3 C) 99 F (37.2 C)  TempSrc:  Oral Oral Oral  SpO2: 100% 100% 100% 100%  Weight:  76.7 kg    Height:  5\' 4"  (1.626 m)     Physical Exam Vitals signs reviewed.  Constitutional:      General: She is not in acute distress.    Appearance: Normal appearance. She is not diaphoretic.  Cardiovascular:     Rate and Rhythm: Normal rate and regular rhythm.     Pulses: Normal pulses.     Heart sounds: Normal heart sounds. No murmur. No friction rub. No gallop.   Skin:    Capillary Refill: Capillary refill takes less than 2 seconds.  Neurological:     General: No focal deficit present.     Mental Status: She is alert and oriented to person, place, and time. Mental status is at baseline.  Psychiatric:        Mood and Affect: Mood normal.        Behavior: Behavior normal.        Thought Content: Thought content normal.        Judgment: Judgment normal.    Assessment/Plan:  Mariah May is a 73yo female with history of HTN, HLD, CVA, OA presenting with two days of generalized weakness in the setting of decreased PO intake noted to have AKI and elevated CK concerning for early rhabdomyolysis.   AKI w/early signs of rhabdomyolysis: Patient presented with AKI and elevated CK with microscopic hemoglobinuria concerning for early signs of rhabdomyolysis. She received IV hydration with resolution of her AKI and her sCr back to baseline at 0.83 this morning. CK trending down 851>324.   - Discharge to home with home health PT/OT - Encourage adequate PO intake   UTI:   Patient reports feeling generalized weakness and being unable to get out of bed for two days. UA with moderate leukocytes and bacteruria. UCx with >100,000 E.coli. She is afebrile without leukocytosis. She does have urinary incontinence at baseline but no new urinary symptoms noted. However, difficult to assess. - Keflex 500mg  q6h x5 days  Physical deconditioning: Patient has significant physical deconditioning resulting in her being unable to get up and prepare her meals resulting in decreased PO intake and subsequent AKI. Patient lives by herself and reports that her grandson stays with her but is mostly out of the home. At baseline, she is able to perform her ADLs and uses a walker for ambulation. Patient was evaluated by PT/OT and recommended for home health PT/OT with 24hr supervision/assistance vs. SNF. Discussed with patient and she reports that she does not want to go to SNF. She understands reason for admission, is able to communicate length of her hospitalization, and understands what to do if she were to have similar symptoms again. Patient has decision making capacity. She prefers to go home with home health that will visit her 2-3 times per week. Will discuss with patient's family regarding safe discharge planning.  - Discharge to home with home health PT/OT  History  of CVA:  - Continue Atorvastatin 40mg  qd - Continue amlodipine 5mg  qd  FEN:  Na restricted diet  Code: FULL  DVT Prophylaxis: Lovenox  Dispo: Anticipated discharge today.   Harvie Heck, MD  Internal Medicine, PGY-1 03/11/2019, 7:40 AM Pager: [phone]

## 2019-03-11 NOTE — Discharge Summary (Signed)
Name: Mariah May MRN: [id number] DOB: 27-Jul-1945 73 y.o. PCP: Patient, No Pcp Per  Date of Admission: 03/09/2019  2:55 PM Date of Discharge: 03/11/2019 Attending Physician: Tyson Alias, *  Discharge Diagnosis: 1. AKI w/ early signs of rhabdomyolysis 2. UTI 3. Physical deconditioning   Discharge Medications: Allergies as of 03/11/2019      Reactions   Contrast Media [iodinated Diagnostic Agents]       Medication List    TAKE these medications   acetaminophen 325 MG tablet Commonly known as: TYLENOL Take 650 mg by mouth every 6 (six) hours as needed for mild pain.   amLODipine 5 MG tablet Commonly known as: NORVASC Take 5 mg by mouth daily.   atorvastatin 40 MG tablet Commonly known as: LIPITOR Take 40 mg by mouth daily.   cephALEXin 500 MG capsule Commonly known as: KEFLEX Take 1 capsule (500 mg total) by mouth every 6 (six) hours for 5 days.       Disposition and follow-up:   [name] was discharged from [facility] in Stable condition.  At the hospital follow up visit please address:  1.  AKI w/ early signs of rhabdomyolysis: Patient presented with AKI and elevated CK concerning for early rhabdomyolysis. AKI resolved with fluid resuscitation and CK trending down. On follow up, assess renal function.   Urinary tract infection: Asymptomatic.UA with leukocytosis and bacteruria, urine culture with >100,000 E.coli. Patient discharged with Keflex 500mg  q6h for 5 days.   Physical deconditioning: Patient discharged home with home health PT/OT/Health Aid due to significant deconditioning.   2.  Labs / imaging needed at time of follow-up: BMP  3.  Pending labs/ test needing follow-up: none  Follow-up Appointments: Follow-up Information    [facility]. Schedule an appointment as soon as possible for a visit in 2 week(s).   Contact information: [address] [place]  [place] [phone]          Hospital Course by problem list: 1. AKI w/early signs of rhabdomyolysis: Patient with baseline sCr 0.84 presented with sCr of 1.22 in setting of decreased PO intake for two days. Also noted to have elevated CK of 851 that improved with fluid resuscitation.   2. Urinary tract infection: Patient presented with generalized weakness. She has urinary incontinence at baseline but denies any worsening of her incontinence, dysuria or any other urinary symptoms. She is afebrile and VSS without any leukocytosis. She has a history of E.coli bacteremia 2/2 UTI in the past for which she was treated with Bactrim. On UA, she has bacteruria and leukocytes but no nitrites. Urine culture positive for >100,000 E.coli. Patient to complete 5 day course of Keflex.   3. Physical deconditioning:  Patient with history of CVA with some RLE weakness (3/5 strength, 4/5 strength in LLE) uses walker to ambulate at baseline. She is independent in her ADLs. Patient evaluated by PT/OT during this admission and recommended for home health PT/OT with 24hr supervision/assistance vs. SNF. Patient denies SNF and prefers home health visits. Patient discharged to home with home health PT/OT/Health aid.    Discharge Vitals:   BP (!) 114/50 (BP Location: Left Arm)   Pulse 71   Temp 99 F (37.2 C) (Oral)   Resp 18   Ht 5\' 4"  (1.626 m)   Wt 76.7 kg   LMP  (LMP Unknown)   SpO2 100%   BMI 29.02 kg/m   Pertinent Labs, Studies, and Procedures:  CBC Latest Ref Rng & Units 03/11/2019 03/10/2019 03/09/2019  WBC 4.0 - 10.5 K/uL 5.4 5.6 8.0  Hemoglobin 12.0 - 15.0 g/dL 11.4(L) 10.8(L) 11.6(L)  Hematocrit 36.0 - 46.0 % 36.8 33.7(L) 37.1  Platelets 150 - 400 K/uL 304 274 319   BMP Latest Ref Rng & Units 03/11/2019 03/10/2019 03/09/2019  Glucose 70 - 99 mg/dL 111(H) - 102(H)  BUN 8 - 23 mg/dL 9 - 12  Creatinine 0.44 - 1.00 mg/dL 0.83 0.83 1.22(H)  Sodium 135 - 145 mmol/L 140 - 140  Potassium 3.5  - 5.1 mmol/L 4.0 - 4.1  Chloride 98 - 111 mmol/L 109 - 106  CO2 22 - 32 mmol/L 20(L) - 22  Calcium 8.9 - 10.3 mg/dL 8.5(L) - 8.9   Urinalysis    Component Value Date/Time   COLORURINE YELLOW 03/10/2019 0655   APPEARANCEUR HAZY (A) 03/10/2019 0655   LABSPEC 1.018 03/10/2019 0655   PHURINE 5.0 03/10/2019 0655   GLUCOSEU NEGATIVE 03/10/2019 0655   HGBUR MODERATE (A) 03/10/2019 0655   BILIRUBINUR NEGATIVE 03/10/2019 0655   [place] 03/10/2019 0655   PROTEINUR NEGATIVE 03/10/2019 0655   NITRITE NEGATIVE 03/10/2019 0655   LEUKOCYTESUR MODERATE (A) 03/10/2019 0655   Urine culture Specimen Description URINE, RANDOM   Special Requests NONE  Performed at [facility], [place] [address], [place], [place] [address]   Culture >=100,000 COLONIES/mL ESCHERICHIA COLIAbnormal      Discharge Instructions: Discharge Instructions    Call MD for:  difficulty breathing, headache or visual disturbances   Complete by: As directed    Call MD for:  extreme fatigue   Complete by: As directed    Call MD for:  persistant dizziness or light-headedness   Complete by: As directed    Call MD for:  persistant nausea and vomiting   Complete by: As directed    Call MD for:  redness, tenderness, or signs of infection (pain, swelling, redness, odor or green/yellow discharge around incision site)   Complete by: As directed    Call MD for:  severe uncontrolled pain   Complete by: As directed    Call MD for:  temperature >100.4   Complete by: As directed    Diet - low sodium heart healthy   Complete by: As directed    Discharge instructions   Complete by: As directed    [name],  You were admitted to the hospital with generalized weakness and decreased appetite for two days. You were found to have and acute kidney injury and a urinary tract infection. You received fluids with improvement in your kidney function. On discharge, please take your antibiotic every 6 hours for 5 days duration.  Due to your deconditioning, you are being discharged home with home health PT/OT/Nurse aid.  Please follow up with a primary care provider in 1-2 weeks.   Take care!   Increase activity slowly   Complete by: As directed       Signed: Harvie Heck, MD  Internal Medicine PGY-1 03/11/2019, 12:59 PM   Pager: [phone]

## 2019-03-12 LAB — URINE CULTURE: Culture: >=100000 — AB

## 2019-04-11 ENCOUNTER — Inpatient Hospital Stay (HOSPITAL_COMMUNITY)
Admission: EM | Admit: 2019-04-11 | Discharge: 2019-04-13 | DRG: 683 | Disposition: A | Discharge status: Home / Self Care | Payer: Medicare Other | Attending: Internal Medicine | Admitting: Internal Medicine

## 2019-04-11 ENCOUNTER — Emergency Department (HOSPITAL_COMMUNITY): Payer: Medicare Other

## 2019-04-11 DIAGNOSIS — Z87440 Personal history of urinary (tract) infections: Secondary | ICD-10-CM | POA: Diagnosis not present

## 2019-04-11 DIAGNOSIS — G9349 Other encephalopathy: Secondary | ICD-10-CM | POA: Diagnosis present

## 2019-04-11 DIAGNOSIS — B9620 Unspecified Escherichia coli [E. coli] as the cause of diseases classified elsewhere: Secondary | ICD-10-CM | POA: Diagnosis present

## 2019-04-11 DIAGNOSIS — N3000 Acute cystitis without hematuria: Secondary | ICD-10-CM

## 2019-04-11 DIAGNOSIS — M6282 Rhabdomyolysis: Secondary | ICD-10-CM | POA: Diagnosis present

## 2019-04-11 DIAGNOSIS — N179 Acute kidney failure, unspecified: Secondary | ICD-10-CM | POA: Diagnosis present

## 2019-04-11 DIAGNOSIS — I2510 Atherosclerotic heart disease of native coronary artery without angina pectoris: Secondary | ICD-10-CM | POA: Diagnosis present

## 2019-04-11 DIAGNOSIS — G9340 Encephalopathy, unspecified: Secondary | ICD-10-CM | POA: Diagnosis present

## 2019-04-11 DIAGNOSIS — Z1611 Resistance to penicillins: Secondary | ICD-10-CM | POA: Diagnosis present

## 2019-04-11 DIAGNOSIS — E860 Dehydration: Secondary | ICD-10-CM | POA: Diagnosis present

## 2019-04-11 DIAGNOSIS — Z8673 Personal history of transient ischemic attack (TIA), and cerebral infarction without residual deficits: Secondary | ICD-10-CM | POA: Diagnosis not present

## 2019-04-11 DIAGNOSIS — N390 Urinary tract infection, site not specified: Secondary | ICD-10-CM | POA: Diagnosis present

## 2019-04-11 DIAGNOSIS — I10 Essential (primary) hypertension: Secondary | ICD-10-CM | POA: Diagnosis present

## 2019-04-11 DIAGNOSIS — Z20822 Contact with and (suspected) exposure to covid-19: Secondary | ICD-10-CM | POA: Diagnosis present

## 2019-04-11 DIAGNOSIS — Z79899 Other long term (current) drug therapy: Secondary | ICD-10-CM | POA: Diagnosis not present

## 2019-04-11 DIAGNOSIS — E861 Hypovolemia: Secondary | ICD-10-CM | POA: Diagnosis present

## 2019-04-11 DIAGNOSIS — Z91041 Radiographic dye allergy status: Secondary | ICD-10-CM | POA: Diagnosis not present

## 2019-04-11 LAB — URINALYSIS, ROUTINE W REFLEX MICROSCOPIC
Glucose, UA: NEGATIVE mg/dL
Ketones, ur: NEGATIVE mg/dL
Nitrite: NEGATIVE
Protein, ur: 30 mg/dL — AB
Specific Gravity, Urine: 1.021 (ref 1.005–1.030)
WBC, UA: >50 WBC/hpf — ABNORMAL HIGH (ref 0–5)
pH: 5 (ref 5.0–8.0)

## 2019-04-11 LAB — COMPREHENSIVE METABOLIC PANEL
ALT: 12 U/L (ref 0–44)
AST: 16 U/L (ref 15–41)
Albumin: 3.2 g/dL — ABNORMAL LOW (ref 3.5–5.0)
Alkaline Phosphatase: 100 U/L (ref 38–126)
Anion gap: 13 (ref 5–15)
BUN: 30 mg/dL — ABNORMAL HIGH (ref 8–23)
CO2: 22 mmol/L (ref 22–32)
Calcium: 9.3 mg/dL (ref 8.9–10.3)
Chloride: 104 mmol/L (ref 98–111)
Creatinine, Ser: 2.23 mg/dL — ABNORMAL HIGH (ref 0.44–1.00)
GFR calc Af Amer: 24 mL/min — ABNORMAL LOW (ref >60)
GFR calc non Af Amer: 21 mL/min — ABNORMAL LOW (ref >60)
Glucose, Bld: 114 mg/dL — ABNORMAL HIGH (ref 70–99)
Potassium: 4.1 mmol/L (ref 3.5–5.1)
Sodium: 139 mmol/L (ref 135–145)
Total Bilirubin: 1.1 mg/dL (ref 0.3–1.2)
Total Protein: 7.9 g/dL (ref 6.5–8.1)

## 2019-04-11 LAB — CBC WITH DIFFERENTIAL/PLATELET
Abs Immature Granulocytes: 0.05 10*3/uL (ref 0.00–0.07)
Basophils Absolute: 0 10*3/uL (ref 0.0–0.1)
Basophils Relative: 1 %
Eosinophils Absolute: 0.1 10*3/uL (ref 0.0–0.5)
Eosinophils Relative: 1 %
HCT: 41.2 % (ref 36.0–46.0)
Hemoglobin: 12.8 g/dL (ref 12.0–15.0)
Immature Granulocytes: 1 %
Lymphocytes Relative: 20 %
Lymphs Abs: 1.6 10*3/uL (ref 0.7–4.0)
MCH: 26.4 pg (ref 26.0–34.0)
MCHC: 31.1 g/dL (ref 30.0–36.0)
MCV: 84.9 fL (ref 80.0–100.0)
Monocytes Absolute: 0.9 10*3/uL (ref 0.1–1.0)
Monocytes Relative: 11 %
Neutro Abs: 5.3 10*3/uL (ref 1.7–7.7)
Neutrophils Relative %: 66 %
Platelets: 292 10*3/uL (ref 150–400)
RBC: 4.85 MIL/uL (ref 3.87–5.11)
RDW: 14.1 % (ref 11.5–15.5)
WBC: 7.9 10*3/uL (ref 4.0–10.5)
nRBC: 0 % (ref 0.0–0.2)

## 2019-04-11 LAB — LACTIC ACID, PLASMA
Lactic Acid, Venous: 2.1 mmol/L (ref 0.5–1.9)
Lactic Acid, Venous: 1.2 mmol/L (ref 0.5–1.9)

## 2019-04-11 LAB — TROPONIN I (HIGH SENSITIVITY)
Troponin I (High Sensitivity): 6 ng/L (ref <18)
Troponin I (High Sensitivity): 8 ng/L (ref <18)

## 2019-04-11 LAB — POC SARS CORONAVIRUS 2 AG -  ED: SARS Coronavirus 2 Ag: NEGATIVE

## 2019-04-11 LAB — SARS CORONAVIRUS 2 (TAT 6-24 HRS): SARS Coronavirus 2: NEGATIVE

## 2019-04-11 LAB — CK: Total CK: 186 U/L (ref 38–234)

## 2019-04-11 MED ORDER — SODIUM CHLORIDE 0.9 % IV SOLN: INTRAVENOUS | Status: DC

## 2019-04-11 MED ORDER — SODIUM CHLORIDE 0.9% FLUSH
3.0000 mL | Freq: Two times a day (BID) | INTRAVENOUS | Status: DC
  Administered 2019-04-13: 08:00:00 3 mL via INTRAVENOUS

## 2019-04-11 MED ORDER — POLYETHYLENE GLYCOL 3350 17 G PO PACK: 17.0000 g | PACK | Freq: Every day | ORAL | Status: DC | PRN

## 2019-04-11 MED ORDER — SODIUM CHLORIDE 0.9 % IV BOLUS
1000.0000 mL | Freq: Once | INTRAVENOUS | Status: AC
  Administered 2019-04-11: 13:00:00 1000 mL via INTRAVENOUS

## 2019-04-11 MED ORDER — ATORVASTATIN CALCIUM 40 MG PO TABS: 40.0000 mg | ORAL_TABLET | Freq: Every day | ORAL | Status: DC

## 2019-04-11 MED ORDER — ACETAMINOPHEN 650 MG RE SUPP: 650.0000 mg | Freq: Four times a day (QID) | RECTAL | Status: DC | PRN

## 2019-04-11 MED ORDER — ACETAMINOPHEN 325 MG PO TABS: 650.0000 mg | ORAL_TABLET | Freq: Four times a day (QID) | ORAL | Status: DC | PRN

## 2019-04-11 MED ORDER — ENOXAPARIN SODIUM 30 MG/0.3ML ~~LOC~~ SOLN
30.0000 mg | SUBCUTANEOUS | Status: DC
  Administered 2019-04-11: 23:00:00 30 mg via SUBCUTANEOUS
  Filled 2019-04-11 (×2): qty 0.3

## 2019-04-11 MED ORDER — ATORVASTATIN CALCIUM 40 MG PO TABS
40.0000 mg | ORAL_TABLET | Freq: Every day | ORAL | Status: DC
  Administered 2019-04-12 – 2019-04-13 (×2): 40 mg via ORAL
  Filled 2019-04-11 (×2): qty 1

## 2019-04-11 MED ORDER — ENOXAPARIN SODIUM 30 MG/0.3ML ~~LOC~~ SOLN
30.0000 mg | SUBCUTANEOUS | Status: DC
  Filled 2019-04-11: qty 0.3

## 2019-04-11 MED ORDER — SODIUM CHLORIDE 0.9 % IV SOLN
1.0000 g | Freq: Once | INTRAVENOUS | Status: AC
  Administered 2019-04-11: 17:00:00 1 g via INTRAVENOUS
  Filled 2019-04-11: qty 10

## 2019-04-11 MED ORDER — SODIUM CHLORIDE 0.9 % IV SOLN: INTRAVENOUS | Status: AC

## 2019-04-11 MED ORDER — SODIUM CHLORIDE 0.9 % IV SOLN
1.0000 g | INTRAVENOUS | Status: DC
  Administered 2019-04-12: 18:00:00 1 g via INTRAVENOUS
  Filled 2019-04-11: qty 10

## 2019-04-11 NOTE — ED Notes (Signed)
Bladder scan showed 160 mL of urine

## 2019-04-11 NOTE — H&P (Signed)
Date: 04/11/2019               Patient Name:  Mariah May MRN: [id number]  DOB: [birth year] Age / Sex: 73 y.o., female   PCP: Patient, No Pcp Per         Medical Service: Internal Medicine Teaching Service         Attending Physician: Dr. Heber [place], Rachel Moulds, DO    First Contact: Dr. Charleen Kirks Pager: [phone]  Second Contact: Dr. Koleen Distance Pager: [phone]       After Hours (After 5p/  First Contact Pager: [phone]  weekends / holidays): Second Contact Pager: [phone]   Chief Complaint: AMS  History of Present Illness:   Mariah May is a 73 yo F with Hx of CVA and recurrent UTI (with recent hospitalization in Nov for UTI and mild Rhabdomyolysis) who presents with 2-3 of lethargy and AMS reported similar to her usual UTI symptoms per family. She does not remember much about her symptoms for the past few day, but states she has not been doing very much.  Patient reports she is feeling at baseline. Denies urinary urgency, dysuria, or discoloration. Denies fevers or abdominal pain.   ED Course:  Labs showed BUN 30, Cr 2.2, Alb 3.2, CBC WNL, hsTrop negative, CK WNL. U/A with large hgb, protein, large leukocytes, >WBC, and Many Bacteria. POC Covid negative. Urine culture, Blood culture, Lactic acid, and repeat hsTrop pending. CXR showed no acute changes.   Meds:  No current facility-administered medications on file prior to encounter.   Current Outpatient Medications on File Prior to Encounter  Medication Sig  . acetaminophen (TYLENOL) 325 MG tablet Take 650 mg by mouth every 6 (six) hours as needed for mild pain.  Marland Kitchen amLODipine (NORVASC) 5 MG tablet Take 5 mg by mouth daily.  Marland Kitchen atorvastatin (LIPITOR) 40 MG tablet Take 40 mg by mouth daily.   Allergies: Allergies as of 04/11/2019 - Review Complete 03/09/2019  Allergen Reaction Noted  . Contrast media [iodinated diagnostic agents]  11/11/2018   Past Medical History:  Diagnosis Date  . Stroke [facility])    Family History:  Denies  any family history.   Social History:  Social History   Tobacco Use  . Smoking status: Never Smoker  . Smokeless tobacco: Never Used  Substance Use Topics  . Alcohol use: Yes  . Drug use: Not on file  Reviewed on admission  Review of Systems: A complete ROS was negative except as per HPI.  Physical Exam: Blood pressure (!) 149/91, pulse 77, temperature 98.5 F (36.9 C), temperature source Oral, resp. rate (!) 22, SpO2 97 %. Physical Exam Vitals and nursing note reviewed.  Constitutional:      General: She is not in acute distress.    Appearance: Normal appearance. She is normal weight.     Comments: Tired appearing, Elderly female, Mildly confused  Eyes:     Extraocular Movements: Extraocular movements intact.     Conjunctiva/sclera: Conjunctivae normal.  Cardiovascular:     Rate and Rhythm: Normal rate and regular rhythm.     Pulses: Normal pulses.     Heart sounds: Normal heart sounds.  Pulmonary:     Effort: Pulmonary effort is normal. No respiratory distress.     Breath sounds: Normal breath sounds. No wheezing or rales.  Abdominal:     General: Bowel sounds are normal. There is no distension.     Palpations: Abdomen is soft.     Tenderness: There is no  abdominal tenderness.  Musculoskeletal:        General: No swelling or deformity.     Right lower leg: Edema (trace pitting) present.     Left lower leg: Edema (trace pitting) present.  Skin:    General: Skin is warm and dry.  Neurological:     General: No focal deficit present.     Mental Status: She is alert and oriented to person, place, and time. Mental status is at baseline.    EKG: personally reviewed my interpretation is baseline wander, sinus rhythm, TWI in anterolateral leads, similar to previous.  CXR: personally reviewed my interpretation is no acute abnormality  Assessment & Plan by Problem: Active Problems:   Encephalopathy  73 yo F with Hx of CVA and recurrent UTI (with recent hospitalization  in Nov for UTI and mild Rhabdomyolysis) who presents with 2-3 of lethargy and AMS reported similar to her usual UTI symptoms per family.  # Encephalopathy # UTI # AKI:  Patient presented after several days of lethargy and AMS per family. This is similar to her usual presentation for UTI per her family, which is why they called the ambulance. On arrival to the ED she was A&Ox2 and for [place] A&O to person, place, and day of the week for [place]. U/A consistent with UTI and BMP showed Prerenal AKI with BUN 30 and Cr 2.2 (baseline 0.8), and CBC was WNL. She had reportedly not been eating or drinking much the past few days. Ceftriaxone started in the ED.  - Continue Ceftriaxone 1g q24h - Follow up Blood and Urine Cultures - IV NS @ 100cc/hr - AM BMP and CBC  # HTN:  BP mildly elevated in the ED, will hold off on ordering Amlodipine until the morning due to her being volume down.  # Hx of CVA:  Continue Statin  Diet: Regular  DVT ppx: Lovenox  Code: Full   Dispo: - EDP spoke with patient's son who is POA, they are hoping to get her to a SNF, which she refused after last admission. Then ultimately help her to move. Will need to coordinate dispo plan with family.  - Admit patient to Inpatient with expected length of stay greater than 2 midnights.  Signed: Dr. Verdene Lennert Internal Medicine PGY-1  Pager: [phone] 04/11/2019, 4:06 PM

## 2019-04-11 NOTE — ED Triage Notes (Signed)
Pt BIB [facility] from home for altered mental status x2-3 days. Family reported to EMS that patient has a history of frequent UTI's and she is presenting like she usually does when she has one. Pt alert and oriented to person and place. Pt denying any pain at present time.

## 2019-04-11 NOTE — ED Provider Notes (Signed)
[facility] EMERGENCY DEPARTMENT Provider Note   CSN: [id number] Arrival date & time: 04/11/19  1139     History Chief Complaint  Patient presents with  . Altered Mental Status    Mariah May is a 73 y.o. female hx of stroke, UTI, here presenting with altered mental status.  Patient actually was fine around Christmas and was cooking a meal for her family.  For the last 2 to 3 days, patient has been laying in bed and very tired and refusing to do much. Has no reported falls or injuries .  Patient states that she just too weak to get up .  Patient was admitted around Thanksgiving time for altered mental status secondary to UTI and rhabdomyolysis.   The history is provided by the patient.       Past Medical History:  Diagnosis Date  . Stroke [facility])     Patient Active Problem List   Diagnosis Date Noted  . UTI (urinary tract infection) 03/11/2019  . AKI (acute kidney injury) ([place]) 03/10/2019  . Elevated CK 03/10/2019  . History of CVA in adulthood 11/14/2018  . Physical deconditioning 11/14/2018    No past surgical history on file.   OB History   No obstetric history on file.     No family history on file.  Social History   Tobacco Use  . Smoking status: Never Smoker  . Smokeless tobacco: Never Used  Substance Use Topics  . Alcohol use: Yes  . Drug use: Not on file    Home Medications Prior to Admission medications   Medication Sig Start Date End Date Taking? Authorizing Provider  acetaminophen (TYLENOL) 325 MG tablet Take 650 mg by mouth every 6 (six) hours as needed for mild pain.    [provider]  amLODipine (NORVASC) 5 MG tablet Take 5 mg by mouth daily. 09/29/18   [provider]  atorvastatin (LIPITOR) 40 MG tablet Take 40 mg by mouth daily. 09/29/18   [provider]    Allergies    Contrast media [iodinated diagnostic agents]  Review of Systems   Review of Systems  Neurological: Positive for  weakness.  All other systems reviewed and are negative.   Physical Exam Updated Vital Signs BP 123/83   Pulse 94   Temp 98.5 F (36.9 C) (Oral)   Resp (!) 26   LMP  (LMP Unknown)   SpO2 99%   Physical Exam Vitals and nursing note reviewed.  Constitutional:      Comments: Confused, A & O x 1.  Chronically ill-appearing.  HENT:     Head: Normocephalic.     Nose: Nose normal.     Mouth/Throat:     Mouth: Mucous membranes are dry.  Eyes:     Extraocular Movements: Extraocular movements intact.     Pupils: Pupils are equal, round, and reactive to light.  Cardiovascular:     Rate and Rhythm: Normal rate and regular rhythm.     Pulses: Normal pulses.     Heart sounds: Normal heart sounds.  Pulmonary:     Effort: Pulmonary effort is normal.     Breath sounds: Normal breath sounds.  Abdominal:     General: Abdomen is flat.     Palpations: Abdomen is soft.  Musculoskeletal:        General: Normal range of motion.     Cervical back: Normal range of motion.  Skin:    General: Skin is warm.     Capillary  Refill: Capillary refill takes less than 2 seconds.  Neurological:     Comments: A & O x 1. Moving all extremities. No obvious facial droop.   Psychiatric:     Comments: Demented      ED Results / Procedures / Treatments   Labs (all labs ordered are listed, but only abnormal results are displayed) Labs Reviewed  URINE CULTURE  CBC WITH DIFFERENTIAL/PLATELET  COMPREHENSIVE METABOLIC PANEL  URINALYSIS, ROUTINE W REFLEX MICROSCOPIC  CK  POC SARS CORONAVIRUS 2 AG -  ED  TROPONIN I (HIGH SENSITIVITY)    EKG EKG Interpretation  Date/Time:  Wednesday April 11 2019 11:52:34 EST Ventricular Rate:  95 PR Interval:    QRS Duration: 95 QT Interval:  375 QTC Calculation: 472 R Axis:   -17 Text Interpretation: Sinus rhythm Consider left atrial enlargement Borderline left axis deviation Nonspecific T abnrm, anterolateral leads No significant change since last tracing  Confirmed by Richardean Canal ([id number]) on 04/11/2019 11:59:43 AM   Radiology DG Chest Port 1 View  Result Date: 04/11/2019 CLINICAL DATA:  Weakness.  Altered mental status. EXAM: PORTABLE CHEST 1 VIEW COMPARISON:  03/09/2019 FINDINGS: The heart size and mediastinal contours are within normal limits. Both lungs are clear. The visualized skeletal structures are unremarkable. IMPRESSION: No active disease. Electronically Signed   By: Signa Kell M.D.   On: 04/11/2019 12:26    Procedures Procedures (including critical care time)  Medications Ordered in ED Medications  sodium chloride 0.9 % bolus 1,000 mL (has no administration in time range)    ED Course  I have reviewed the triage vital signs and the nursing notes.  Pertinent labs & imaging results that were available during my care of the patient were reviewed by me and considered in my medical decision making (see chart for details).    MDM Rules/Calculators/A&P                      Mariah May is a 73 y.o. female here with weakness and altered mental status.  Patient is very confused and has history of this before that secondary to UTI. Patient afebrile, no signs of trauma. Will get labs, CXR, UA. Will hydrate and contact family.   2:57 PM Labs showed acute renal failure with creatinine of 2.3.  Patient urinalysis showed UTI again. I had extensive discussion with the son who is the healthcare proxy.  He was uncertain about the last hospitalization stay.  Apparently she was offered rehab but she declined and home health care services are not enough for her.  I had extensive discussion with him and will admit for hydration for now and will get case management to consider options for her.   Final Clinical Impression(s) / ED Diagnoses Final diagnoses:  None    Rx / DC Orders ED Discharge Orders    None       Mariah Pander, MD 04/11/19 1504

## 2019-04-11 NOTE — ED Notes (Signed)
Dinner Tray Ordered @ 1712. 

## 2019-04-12 ENCOUNTER — Encounter (HOSPITAL_COMMUNITY): Payer: Self-pay | Admitting: Internal Medicine

## 2019-04-12 ENCOUNTER — Other Ambulatory Visit: Payer: Self-pay

## 2019-04-12 DIAGNOSIS — Z87440 Personal history of urinary (tract) infections: Secondary | ICD-10-CM

## 2019-04-12 DIAGNOSIS — G9340 Encephalopathy, unspecified: Secondary | ICD-10-CM

## 2019-04-12 DIAGNOSIS — I2510 Atherosclerotic heart disease of native coronary artery without angina pectoris: Secondary | ICD-10-CM

## 2019-04-12 DIAGNOSIS — Z79899 Other long term (current) drug therapy: Secondary | ICD-10-CM

## 2019-04-12 DIAGNOSIS — N179 Acute kidney failure, unspecified: Principal | ICD-10-CM

## 2019-04-12 DIAGNOSIS — I10 Essential (primary) hypertension: Secondary | ICD-10-CM

## 2019-04-12 DIAGNOSIS — Z91041 Radiographic dye allergy status: Secondary | ICD-10-CM

## 2019-04-12 DIAGNOSIS — Z8673 Personal history of transient ischemic attack (TIA), and cerebral infarction without residual deficits: Secondary | ICD-10-CM

## 2019-04-12 LAB — BASIC METABOLIC PANEL
Anion gap: 11 (ref 5–15)
BUN: 32 mg/dL — ABNORMAL HIGH (ref 8–23)
CO2: 20 mmol/L — ABNORMAL LOW (ref 22–32)
Calcium: 8.3 mg/dL — ABNORMAL LOW (ref 8.9–10.3)
Chloride: 108 mmol/L (ref 98–111)
Creatinine, Ser: 1.46 mg/dL — ABNORMAL HIGH (ref 0.44–1.00)
GFR calc Af Amer: 40 mL/min — ABNORMAL LOW (ref >60)
GFR calc non Af Amer: 35 mL/min — ABNORMAL LOW (ref >60)
Glucose, Bld: 94 mg/dL (ref 70–99)
Potassium: 3.8 mmol/L (ref 3.5–5.1)
Sodium: 139 mmol/L (ref 135–145)

## 2019-04-12 LAB — CBC
HCT: 33.3 % — ABNORMAL LOW (ref 36.0–46.0)
Hemoglobin: 10.6 g/dL — ABNORMAL LOW (ref 12.0–15.0)
MCH: 26.7 pg (ref 26.0–34.0)
MCHC: 31.8 g/dL (ref 30.0–36.0)
MCV: 83.9 fL (ref 80.0–100.0)
Platelets: 267 10*3/uL (ref 150–400)
RBC: 3.97 MIL/uL (ref 3.87–5.11)
RDW: 14.1 % (ref 11.5–15.5)
WBC: 6.9 10*3/uL (ref 4.0–10.5)
nRBC: 0 % (ref 0.0–0.2)

## 2019-04-12 MED ORDER — SODIUM CHLORIDE 0.9 % IV SOLN: INTRAVENOUS | Status: AC

## 2019-04-12 NOTE — Plan of Care (Signed)

## 2019-04-12 NOTE — Plan of Care (Signed)
  Problem: Education: Goal: Knowledge of General Education information will improve Description Including pain rating scale, medication(s)/side effects and non-pharmacologic comfort measures Outcome: Progressing   Problem: Activity: Goal: Risk for activity intolerance will decrease Outcome: Progressing   Problem: Safety: Goal: Ability to remain free from injury will improve Outcome: Progressing   

## 2019-04-12 NOTE — Progress Notes (Signed)
   Subjective: Pt does not appear to be in acute distress. More alert today, mental appears to have improved since admission yesterday on 12/30. States her appetite is ok. She was not able to answer questions regarding urinary symptoms. She informed us of a family member who works in the area and stated it would be ok to call.    Objective:  Vital signs in last 24 hours: Vitals:   04/11/19 2115 04/11/19 2200 04/12/19 0433 04/12/19 0734  BP: 107/70 112/66 108/71 116/62  Pulse:  97 81 69  Resp: (!) 26 16 16 18   Temp:  98.8 F (37.1 C) 98.5 F (36.9 C) 98.4 F (36.9 C)  TempSrc:  Oral Oral Oral  SpO2:  100% 100% 99%   Weight change:   Intake/Output Summary (Last 24 hours) at 04/12/2019 1037 Last data filed at 04/12/2019 0500 Gross per 24 hour  Intake 1572.91 ml  Output 400 ml  Net 1172.91 ml   Physical Exam: Gen: Sitting up in bed eating breakfast, NAD.  CV: RRR no m/g.r Pulm: CTAB normal WOB Abdomen: soft, nt/nd MSK: moving all limbs spontaneously Neuro: no focal deficits, AOx3   Psych: normal affect, able to respond to most questions when prompted  Assessment/Plan:  Active Problems:   Encephalopathy   Encephalopathy  UTI  Mental status appears to have improved since yesterday 123/30 in the ED, she is able tor respond to question about history and give [place] the names of family members in the area that we could contact. At this time it is uncertain whether her decline yesterday represents a fulminant UTI vs dehydration  with colonization. UA yesterday certainly suggestive UTI however she remains afebrile with no leukocytosis or systemic signs or symptoms of infection. Will continue to cover for UTI with ceftriaxone for several days, she likely does not need full course of abx; pending result of UCx reasonable to move to oral coverage. - Continue Ceftriaxone 1g q24h; consider switch to oral pending cultures and sensitivities. - BCx no growth at 24 hrs; continue f/u BCx and  UCx - IV NS @ 100cc/hr  AKI:  Creatinine down from 2.23 on 12/30 to 1.46 today on 12/31. PVR 160 mL. Likely a pre-renal etiology given collateral history obtained from ED admitting team stated she had been lying in bed not drinking fluids. Further supported with improvement of Cr with fluid drip. Will continue to monitor however at this point expect her Cr to continue to improve. 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 Will continue to hold amlodipine. - hold amplodipine - continue atorvastatin 40 mg PO daily  Diet - Regular Fluids - IV NS @ 100cc/hr DVT ppx -  enoxaparin Bowel Regimen - miralax   Code Status: Full Code     LOS: 1 day   Malva Cogan, Medical Student 04/12/2019, 10:37 AM Pager: [phone]  Attestation for Student Documentation:  I personally was present and performed or re-performed the history, physical exam and medical decision-making activities of this service and have verified that the service and findings are accurately documented in the student's note.  Jose Persia, MD 04/12/2019, 11:44 AM Pager: [phone]

## 2019-04-12 NOTE — Progress Notes (Signed)
Internal Medicine Attending:   I saw and examined the patient. I reviewed Dr Noni Saupe note and I agree with the resident's findings and plan as documented in the resident's note.  Overall mental status has improved with IV fluid rehydration as well as antibiotics.  She denies any current dysuria.  Unclear if this is a true urinary tract infection or asymptomatic bacteriuria given her acute encephalopathy and the possibility that it is a toxic/septic encephalopathy caused by a UTI we will continue to treat with antibiotics however if she continues rapid improvement I would limit this to just a 3-day course.  More likely the cause of her acute encephalopathy is simply dehydration and her renal function appears to be greatly improving.  Anticipate discharge tomorrow.

## 2019-04-13 LAB — URINE CULTURE: Culture: >=100000 — AB

## 2019-04-13 MED ORDER — CEPHALEXIN 500 MG PO CAPS: 500.0000 mg | ORAL_CAPSULE | Freq: Four times a day (QID) | ORAL | 0 refills | Status: AC

## 2019-04-13 MED ORDER — CEPHALEXIN 500 MG PO CAPS
500.0000 mg | ORAL_CAPSULE | Freq: Four times a day (QID) | ORAL | Status: DC
  Administered 2019-04-13 (×2): 500 mg via ORAL
  Filled 2019-04-13 (×2): qty 1

## 2019-04-13 NOTE — Discharge Summary (Signed)
Name: Mariah May MRN: [id number] DOB: Sep 08, 1945 74 y.o. PCP: Patient, No Pcp Per  Date of Admission: 04/11/2019 11:43 AM Date of Discharge:  Attending Physician: Gust Rung, DO  Discharge Diagnosis: 1. Urinary Tract Infection 2. Acute Kidney Injury  3. Acute Encephalopathy  Discharge Medications: Allergies as of 04/13/2019      Reactions   Contrast Media [iodinated Diagnostic Agents]       Medication List    TAKE these medications   acetaminophen 325 MG tablet Commonly known as: TYLENOL Take 650 mg by mouth every 6 (six) hours as needed for mild pain.   amLODipine 2.5 MG tablet Commonly known as: NORVASC Take 2.5 mg by mouth daily.   atorvastatin 40 MG tablet Commonly known as: LIPITOR Take 40 mg by mouth daily.   cephALEXin 500 MG capsule Commonly known as: KEFLEX Take 1 capsule (500 mg total) by mouth every 6 (six) hours.       Disposition and follow-up:   Mariah May was discharged from [facility] in Good condition.  At the hospital follow up visit please address:  1.  Recent AKI. Refused labs on last day of admission so unable to ensure resolution. Please check BMP.   2.  Labs / imaging needed at time of follow-up: BMP  3.  Pending labs/ test needing follow-up:   Hospital Course by problem list: 1. Urinary Tract Infection UA positive for leukocytes with many bacteria. Cultures grew E. Coli with susceptibilities  On resistant to ampicillin and Ampicillin-Sulbactam. During hospitalization, she was treated with Ceftriaxone and discharged with Keflex based on susceptibilities from 1 month prior.   2. Acute Kidney Injury Secondary to dehydration and hypovolemia. Improved with IVF, however patient refused labs on last day of admission so unable to ensure creatinine returned to baseline.   3. Acute Encephalopathy Secondary to UTI and AKI. Resolved by second day of admission after IVF and antibiotics.   Discharge Vitals:   BP  (!) 107/58 (BP Location: Left Arm)   Pulse 60   Temp 98.6 F (37 C) (Oral)   Resp 18   LMP  (LMP Unknown)   SpO2 99%   Pertinent Labs, Studies, and Procedures:  BMP Latest Ref Rng & Units 04/12/2019 04/11/2019 03/11/2019  Glucose 70 - 99 mg/dL 94 798(X) 211(H)  BUN 8 - 23 mg/dL 41(D) 40(C) 9  Creatinine 0.44 - 1.00 mg/dL 1.44(Y) 1.85(U) 3.14  Sodium 135 - 145 mmol/L 139 139 140  Potassium 3.5 - 5.1 mmol/L 3.8 4.1 4.0  Chloride 98 - 111 mmol/L 108 104 109  CO2 22 - 32 mmol/L 20(L) 22 20(L)  Calcium 8.9 - 10.3 mg/dL 8.3(L) 9.3 8.5(L)   Urinalysis    Component Value Date/Time   COLORURINE AMBER (A) 04/11/2019 1325   APPEARANCEUR CLOUDY (A) 04/11/2019 1325   LABSPEC 1.021 04/11/2019 1325   PHURINE 5.0 04/11/2019 1325   GLUCOSEU NEGATIVE 04/11/2019 1325   HGBUR LARGE (A) 04/11/2019 1325   BILIRUBINUR SMALL (A) 04/11/2019 1325   KETONESUR NEGATIVE 04/11/2019 1325   PROTEINUR 30 (A) 04/11/2019 1325   NITRITE NEGATIVE 04/11/2019 1325   LEUKOCYTESUR LARGE (A) 04/11/2019 1325    Discharge Instructions: Discharge Instructions    Call MD for:  extreme fatigue   Complete by: As directed    Call MD for:  persistant dizziness or light-headedness   Complete by: As directed    Call MD for:  persistant nausea and vomiting   Complete by: As directed  Call MD for:  severe uncontrolled pain   Complete by: As directed    Call MD for:  temperature >100.4   Complete by: As directed    Diet - low sodium heart healthy   Complete by: As directed    Discharge instructions   Complete by: As directed    Thank you for allowing [place] to care for you during hospitalization.   You were admitted for confusion and altered mental status. We found that you had a urinary tract infection, which we are treating with antibiotics. In addition, you had an acute kidney injury that was from dehydration. Please make sure to drink plenty of fluids at home to avoid this occurring again.   Follow up with your  primary care provider in the next 1-2 weeks.   Increase activity slowly   Complete by: As directed       Signed: Dr. Jose Persia Internal Medicine PGY-1  Pager: [phone] 04/13/2019, 12:08 PM

## 2019-04-13 NOTE — Progress Notes (Addendum)
Patient refusing Lovenox and morning labs. Discussed risk and possible outcomes of refusing including DVT, PE, and even death. Patient said" I am not getting stuck anymore".   - SCD's

## 2019-04-13 NOTE — Progress Notes (Addendum)
Pt refused Lovenox shot even after being education. Pt refused morning labs.  Dr. Barbaraann Faster notified about pt refusing lovenox and lab work. Pt states she does not want to get stuck with a needle.   Will continue to monitor pt.

## 2019-04-13 NOTE — Progress Notes (Signed)
   Subjective:   Feels well but she is very hungry. She is upset with the care she has been receiving and does not feel she is getting everything she needs. She does not want lovenox shots or further blood work. She feels she is back to her normal self. We discussed that she can be discharged today. She wants to go home. Her grandson and his wife live with her and provide care/support for her. We will also touch base with her daughter today.   Objective:  Vital signs in last 24 hours: Vitals:   04/12/19 0734 04/12/19 1452 04/12/19 2013 04/13/19 0328  BP: 116/62 119/62 123/72 137/61  Pulse: 69 71 77 77  Resp: 18 17 14 16   Temp: 98.4 F (36.9 C) 97.9 F (36.6 C) 99.3 F (37.4 C) 98.5 F (36.9 C)  TempSrc: Oral Oral Oral Oral  SpO2: 99% 100% 99% 98%    Physical Exam Constitutional:      Appearance: She is not ill-appearing.  Cardiovascular:     Rate and Rhythm: Normal rate and regular rhythm.  Pulmonary:     Effort: Pulmonary effort is normal.     Breath sounds: Normal breath sounds.  Abdominal:     General: Abdomen is flat. Bowel sounds are normal.     Palpations: Abdomen is soft.  Neurological:     Mental Status: She is alert.    Assessment/Plan:  Active Problems:   Encephalopathy   # Acute Encephalopathy  # Urinary Tract Infection  Improved mentation with treatment of UTI and AKI. Will send home with oral antibiotics. Culture growing E. Coli, susceptibilities are not back, so will use prior susceptibility from culture 1 month ago.   - We will discharge her on Keflex 500 mg every 6 hours for an additional 4 days   # Acute Kidney Injury  Secondary to hypovolemia in light of several days not drinking fluids. Improved with IVF. Patient denied labs this AM so unable to ensure complete resolution. No further interventions at this time. She will need repeat BMP as an outpatient.   Dispo: Anticipated discharge   Dr. Internal Medicine PGY-1  Pager:  [phone] 04/13/2019, 7:29 AM

## 2019-04-16 LAB — CULTURE, BLOOD (ROUTINE X 2)
Culture: NO GROWTH
Culture: NO GROWTH

## 2021-08-16 IMAGING — DX DG CHEST 1V PORT
1 series · 1 of 1 positions shown · non-contrast
Comparison: 11/11/2018

CLINICAL DATA: Weakness

EXAM:
PORTABLE CHEST 1 VIEW

[chest ap]
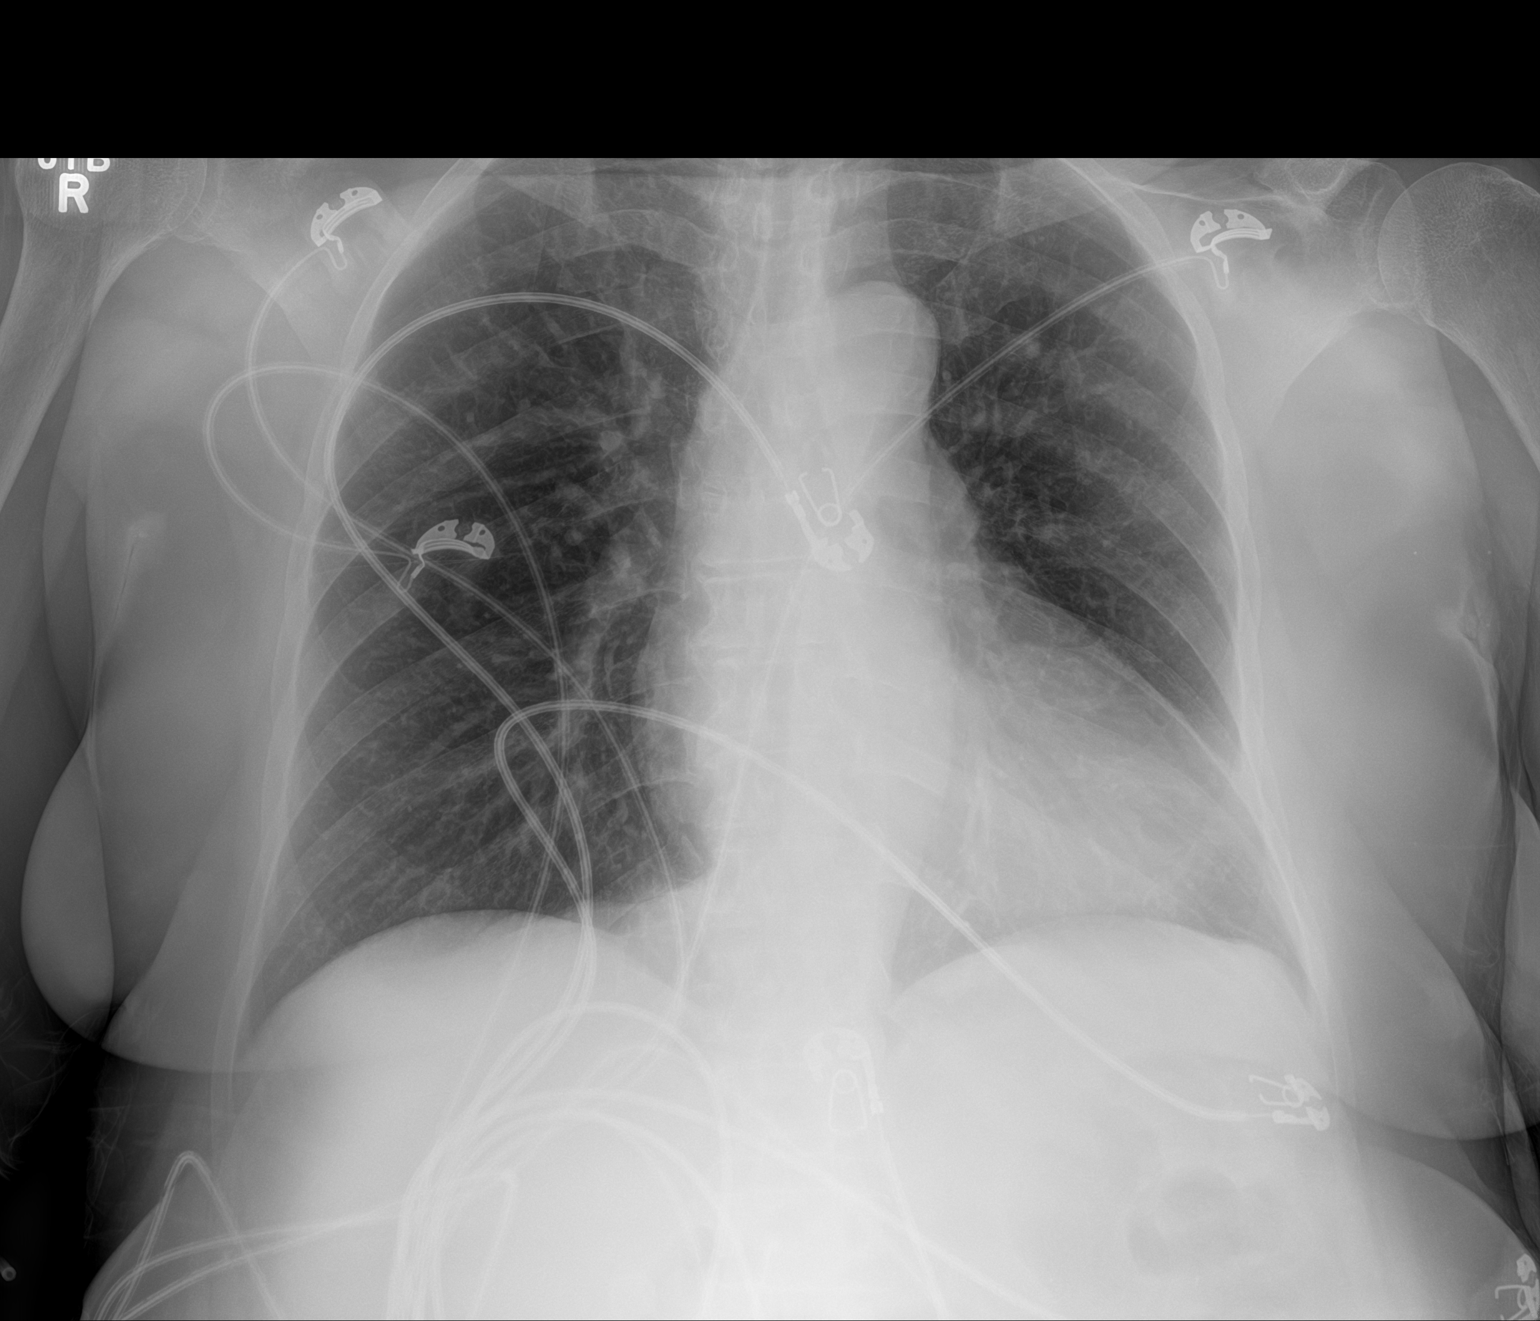

[1 of 1 positions shown; findings below may reference images not displayed]

FINDINGS: Artifact from EKG leads.

Borderline heart size.  Aortic tortuosity.

There is no edema, consolidation, effusion, or pneumothorax.
IMPRESSION: No evidence of active disease.
